# Patient Record
Sex: Male | Born: 1950 | Race: White | Hispanic: No | Marital: Single | Smoking: Current some day smoker
Health system: Southern US, Community
[De-identification: ages and names within clinical notes are randomized; demographics above are authoritative.]

## PROBLEM LIST (undated history)

## (undated) DIAGNOSIS — C060 Malignant neoplasm of cheek mucosa: Secondary | ICD-10-CM

## (undated) DIAGNOSIS — M5030 Other cervical disc degeneration, unspecified cervical region: Secondary | ICD-10-CM

## (undated) DIAGNOSIS — M51369 Other intervertebral disc degeneration, lumbar region without mention of lumbar back pain or lower extremity pain: Secondary | ICD-10-CM

## (undated) DIAGNOSIS — D329 Benign neoplasm of meninges, unspecified: Secondary | ICD-10-CM

## (undated) DIAGNOSIS — M5136 Other intervertebral disc degeneration, lumbar region: Secondary | ICD-10-CM

## (undated) HISTORY — PX: TOTAL HIP REVISION: SHX763

---

## 2011-05-12 DIAGNOSIS — C069 Malignant neoplasm of mouth, unspecified: Secondary | ICD-10-CM | POA: Insufficient documentation

## 2012-05-02 DIAGNOSIS — C069 Malignant neoplasm of mouth, unspecified: Secondary | ICD-10-CM | POA: Insufficient documentation

## 2012-05-13 DIAGNOSIS — E041 Nontoxic single thyroid nodule: Secondary | ICD-10-CM | POA: Insufficient documentation

## 2012-07-22 DIAGNOSIS — G609 Hereditary and idiopathic neuropathy, unspecified: Secondary | ICD-10-CM | POA: Insufficient documentation

## 2012-09-07 DIAGNOSIS — N200 Calculus of kidney: Secondary | ICD-10-CM | POA: Insufficient documentation

## 2012-09-07 DIAGNOSIS — R3911 Hesitancy of micturition: Secondary | ICD-10-CM | POA: Insufficient documentation

## 2012-09-07 DIAGNOSIS — R399 Unspecified symptoms and signs involving the genitourinary system: Secondary | ICD-10-CM | POA: Insufficient documentation

## 2015-06-11 ENCOUNTER — Inpatient Hospital Stay (HOSPITAL_COMMUNITY)
Admission: EM | Admit: 2015-06-11 | Discharge: 2015-06-13 | DRG: 190 | Disposition: A | Discharge status: Home / Self Care | Payer: Self-pay | Attending: Internal Medicine | Admitting: Internal Medicine

## 2015-06-11 ENCOUNTER — Emergency Department (HOSPITAL_COMMUNITY): Payer: Self-pay

## 2015-06-11 ENCOUNTER — Encounter (HOSPITAL_COMMUNITY): Payer: Self-pay | Admitting: Cardiology

## 2015-06-11 DIAGNOSIS — R112 Nausea with vomiting, unspecified: Secondary | ICD-10-CM | POA: Diagnosis present

## 2015-06-11 DIAGNOSIS — Z85818 Personal history of malignant neoplasm of other sites of lip, oral cavity, and pharynx: Secondary | ICD-10-CM

## 2015-06-11 DIAGNOSIS — D72829 Elevated white blood cell count, unspecified: Secondary | ICD-10-CM

## 2015-06-11 DIAGNOSIS — Z590 Homelessness: Secondary | ICD-10-CM

## 2015-06-11 DIAGNOSIS — J209 Acute bronchitis, unspecified: Secondary | ICD-10-CM | POA: Diagnosis present

## 2015-06-11 DIAGNOSIS — F17200 Nicotine dependence, unspecified, uncomplicated: Secondary | ICD-10-CM | POA: Diagnosis present

## 2015-06-11 DIAGNOSIS — R1310 Dysphagia, unspecified: Secondary | ICD-10-CM | POA: Diagnosis present

## 2015-06-11 DIAGNOSIS — J440 Chronic obstructive pulmonary disease with acute lower respiratory infection: Principal | ICD-10-CM | POA: Diagnosis present

## 2015-06-11 DIAGNOSIS — J189 Pneumonia, unspecified organism: Secondary | ICD-10-CM | POA: Diagnosis present

## 2015-06-11 HISTORY — DX: Other intervertebral disc degeneration, lumbar region without mention of lumbar back pain or lower extremity pain: M51.369

## 2015-06-11 HISTORY — DX: Other intervertebral disc degeneration, lumbar region: M51.36

## 2015-06-11 HISTORY — DX: Malignant neoplasm of cheek mucosa: C06.0

## 2015-06-11 LAB — URINALYSIS, ROUTINE W REFLEX MICROSCOPIC
BILIRUBIN URINE: NEGATIVE
GLUCOSE, UA: NEGATIVE mg/dL
HGB URINE DIPSTICK: NEGATIVE
KETONES UR: NEGATIVE mg/dL
Leukocytes, UA: NEGATIVE
Nitrite: NEGATIVE
PH: 6 (ref 5.0–8.0)
Protein, ur: NEGATIVE mg/dL
SPECIFIC GRAVITY, URINE: 1.008 (ref 1.005–1.030)

## 2015-06-11 LAB — I-STAT CG4 LACTIC ACID, ED: Lactic Acid, Venous: 1.93 mmol/L (ref 0.5–2.0)

## 2015-06-11 LAB — CBC
HEMATOCRIT: 40.6 % (ref 39.0–52.0)
Hemoglobin: 12.7 g/dL — ABNORMAL LOW (ref 13.0–17.0)
MCH: 27.9 pg (ref 26.0–34.0)
MCHC: 31.3 g/dL (ref 30.0–36.0)
MCV: 89 fL (ref 78.0–100.0)
PLATELETS: 271 10*3/uL (ref 150–400)
RBC: 4.56 MIL/uL (ref 4.22–5.81)
RDW: 13.7 % (ref 11.5–15.5)
WBC: 22.1 10*3/uL — AB (ref 4.0–10.5)

## 2015-06-11 LAB — COMPREHENSIVE METABOLIC PANEL
ALBUMIN: 3.9 g/dL (ref 3.5–5.0)
ALT: 20 U/L (ref 17–63)
AST: 23 U/L (ref 15–41)
Alkaline Phosphatase: 71 U/L (ref 38–126)
Anion gap: 11 (ref 5–15)
BILIRUBIN TOTAL: 1.4 mg/dL — AB (ref 0.3–1.2)
BUN: 12 mg/dL (ref 6–20)
CHLORIDE: 102 mmol/L (ref 101–111)
CO2: 25 mmol/L (ref 22–32)
CREATININE: 0.94 mg/dL (ref 0.61–1.24)
Calcium: 9.1 mg/dL (ref 8.9–10.3)
GFR calc Af Amer: >60 mL/min (ref >60)
GLUCOSE: 98 mg/dL (ref 65–99)
Potassium: 3.6 mmol/L (ref 3.5–5.1)
Sodium: 138 mmol/L (ref 135–145)
Total Protein: 7.7 g/dL (ref 6.5–8.1)

## 2015-06-11 LAB — INFLUENZA PANEL BY PCR (TYPE A & B)
H1N1 flu by pcr: NOT DETECTED
Influenza A By PCR: NEGATIVE
Influenza B By PCR: NEGATIVE

## 2015-06-11 MED ORDER — SODIUM CHLORIDE 0.9 % IV SOLN
INTRAVENOUS | Status: DC
  Administered 2015-06-11 – 2015-06-13 (×3): via INTRAVENOUS

## 2015-06-11 MED ORDER — GABAPENTIN 300 MG PO CAPS
600.0000 mg | ORAL_CAPSULE | Freq: Three times a day (TID) | ORAL | Status: DC
  Administered 2015-06-11 – 2015-06-13 (×6): 600 mg via ORAL
  Filled 2015-06-11 (×6): qty 2

## 2015-06-11 MED ORDER — IPRATROPIUM-ALBUTEROL 0.5-2.5 (3) MG/3ML IN SOLN
3.0000 mL | Freq: Once | RESPIRATORY_TRACT | Status: AC
  Administered 2015-06-11: 3 mL via RESPIRATORY_TRACT
  Filled 2015-06-11: qty 3

## 2015-06-11 MED ORDER — OXYCODONE-ACETAMINOPHEN 5-325 MG PO TABS
1.0000 | ORAL_TABLET | Freq: Four times a day (QID) | ORAL | Status: DC | PRN
  Administered 2015-06-12 – 2015-06-13 (×4): 1 via ORAL
  Filled 2015-06-11 (×4): qty 1

## 2015-06-11 MED ORDER — CYCLOBENZAPRINE HCL 5 MG PO TABS: 5.0000 mg | ORAL_TABLET | Freq: Three times a day (TID) | ORAL | Status: DC | PRN

## 2015-06-11 MED ORDER — ACETAMINOPHEN 325 MG PO TABS
650.0000 mg | ORAL_TABLET | Freq: Once | ORAL | Status: AC
  Administered 2015-06-11: 650 mg via ORAL
  Filled 2015-06-11: qty 2

## 2015-06-11 MED ORDER — LEVOFLOXACIN IN D5W 750 MG/150ML IV SOLN
750.0000 mg | INTRAVENOUS | Status: DC
  Administered 2015-06-12 – 2015-06-13 (×2): 750 mg via INTRAVENOUS
  Filled 2015-06-11 (×2): qty 150

## 2015-06-11 MED ORDER — LEVOFLOXACIN 750 MG PO TABS
750.0000 mg | ORAL_TABLET | Freq: Once | ORAL | Status: AC
  Administered 2015-06-11: 750 mg via ORAL
  Filled 2015-06-11: qty 1

## 2015-06-11 MED ORDER — SODIUM CHLORIDE 0.9 % IV BOLUS (SEPSIS)
1000.0000 mL | Freq: Once | INTRAVENOUS | Status: AC
  Administered 2015-06-11: 1000 mL via INTRAVENOUS

## 2015-06-11 MED ORDER — ENOXAPARIN SODIUM 40 MG/0.4ML ~~LOC~~ SOLN
40.0000 mg | SUBCUTANEOUS | Status: DC
  Administered 2015-06-11: 40 mg via SUBCUTANEOUS
  Filled 2015-06-11 (×2): qty 0.4

## 2015-06-11 MED ORDER — IBUPROFEN 400 MG PO TABS
800.0000 mg | ORAL_TABLET | Freq: Three times a day (TID) | ORAL | Status: DC | PRN
Start: 2015-06-11 — End: 2015-06-13
  Administered 2015-06-13: 800 mg via ORAL
  Filled 2015-06-11: qty 2

## 2015-06-11 NOTE — H&P (Signed)
[facility] History and Physical  Nathaniel Miles [id number] DOB: 1950/07/13 DOA: 06/11/2015  Referring physician: erRalene Bathe PCP: No primary care provider on file.   Chief Complaint: fatigue  HPI: Nathaniel Miles is a 65 y.o. male  With limited PMHx of DDD and SCC of mouth.  He is a smoker but has been unable to smoke today as he has felt so bad.  He for the last few days says he was been coughing and very fatigued.  Cough is productive of thick yellow sputum.  Claims to have fevers but not checked.  No CP.  Says his symptoms are worsening.  Although he was very limited with the amount of information given and got angry with questioning.  He lives in homeless shelter and has been around sick people.  In the Er, his O2 sats ranged from 88-90 with activity and said he was unable to get up and walk.  Xray did not show PNA.  But his WBC count was significantly elevated at 22.  Flu checked and given levaquin    Review of Systems:  All systems reviewed, negative unless stated above   Past Medical History  Diagnosis Date  . DDD (degenerative disc disease), lumbar   . 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 No pertinent past surgical history. Social History:  reports that he has been smoking.  He does not have any smokeless tobacco history on file. He reports that he does not drink alcohol or use illicit drugs.  Allergies  Allergen Reactions  . Penicillins Anaphylaxis    Has patient had a PCN reaction causing immediate rash, facial/tongue/throat swelling, SOB or lightheadedness with hypotension: Yes Has patient had a PCN reaction causing severe rash involving mucus membranes or skin necrosis: No Has patient had a PCN reaction that required hospitalization Yes Has patient had a PCN reaction occurring within the last 10 years: No If all of the above answers are "NO", then may proceed with Cephalosporin use.   . Stadol [Butorphanol]     History reviewed.  No pertinent family history. -refused to answer "I already told someone"   Prior to Admission medications   Medication Sig Start Date End Date Taking? Authorizing Provider  cyclobenzaprine (FLEXERIL) 10 MG tablet Take 5 mg by mouth 3 (three) times daily as needed for muscle spasms.   Yes Historical Provider, MD  gabapentin (NEURONTIN) 300 MG capsule Take 600 mg by mouth 3 (three) times daily.   Yes Historical Provider, MD  ibuprofen (ADVIL,MOTRIN) 800 MG tablet Take 800 mg by mouth every 8 (eight) hours as needed for moderate pain.    Yes Historical Provider, MD  oxyCODONE-acetaminophen (PERCOCET/ROXICET) 5-325 MG tablet Take 1 tablet by mouth every 6 (six) hours as needed for severe pain.   Yes Historical Provider, MD   Physical Exam: Filed Vitals:   06/11/15 1445 06/11/15 1500 06/11/15 1515 06/11/15 1600  BP: 122/70  130/67   Pulse: 90  87   Temp:      TempSrc:      Resp:  15  13  Height:      Weight:      SpO2: 94% 95% 93% 95%    Wt Readings from Last 3 Encounters:  06/11/15 88.905 kg (196 lb)    General:  Poor eye contact, angry Eyes: PERRL, normal lids, irises & conjunctiva ENT: grossly normal hearing, lips & tongue Neck: no LAD, masses or thyromegaly Cardiovascular: RRR, no m/r/g. No LE edema. Telemetry: SR,  no arrhythmias  Respiratory: rale in left base.no wheezing. Normal respiratory effort. Abdomen: soft, ntnd Skin: no rash or induration seen on limited exam Musculoskeletal: grossly normal tone BUE/BLE Psychiatric: grossly normal mood and affect, speech fluent and appropriate Neurologic: grossly non-focal.          Labs on Admission:  Basic Metabolic Panel:  Recent Labs Lab 06/11/15 1203  NA 138  K 3.6  CL 102  CO2 25  GLUCOSE 98  BUN 12  CREATININE 0.94  CALCIUM 9.1   Liver Function Tests:  Recent Labs Lab 06/11/15 1203  AST 23  ALT 20  ALKPHOS 71  BILITOT 1.4*  PROT 7.7  ALBUMIN 3.9   No results for input(s): LIPASE, AMYLASE in the last  168 hours. No results for input(s): AMMONIA in the last 168 hours. CBC:  Recent Labs Lab 06/11/15 1203  WBC 22.1*  HGB 12.7*  HCT 40.6  MCV 89.0  PLT 271   Cardiac Enzymes: No results for input(s): CKTOTAL, CKMB, CKMBINDEX, TROPONINI in the last 168 hours.  BNP (last 3 results) No results for input(s): BNP in the last 8760 hours.  ProBNP (last 3 results) No results for input(s): PROBNP in the last 8760 hours. - CBG: No results for input(s): GLUCAP in the last 168 hours.  Radiological Exams on Admission: Dg Chest 2 View  06/11/2015  CLINICAL DATA:  Productive cough for 2 days EXAM: CHEST  2 VIEW COMPARISON:  None. FINDINGS: Cardiomediastinal silhouette is unremarkable. No acute infiltrate or pleural effusion. No pulmonary edema. Bony thorax is unremarkable. IMPRESSION: No active cardiopulmonary disease. Electronically Signed   By: Lahoma Crocker M.D.   On: 06/11/2015 13:06      Assessment/Plan Active Problems:   CAP (community acquired pneumonia)  Leukocytosis -presumed PNA/COPD -levaquin -obs  Weakness -PT eval  Tobacco abuse -encourage cessation  Homeless -living in [place] or [place]-- told me both -consult social work  H/o cancer in mouth -says he was treated at [facility] -no records found there  Suspect patient can walk but does not want to go back to shelter-- got VERY angry when I started asking questions regarding what he was doing in [place] Status: full DVT Prophylaxis: Family Communication:  Disposition Plan:   Time spent: 3 min  [facility] Pager [phone]

## 2015-06-11 NOTE — ED Notes (Signed)
Pt. O2 saturation dropping while sleeping. Pt. Placed on 2L O2 at this time.

## 2015-06-11 NOTE — ED Notes (Signed)
Pt reports flu like symptoms for the past couple of days. Cough, congestion and n/v. Reports sick contacts at home.

## 2015-06-11 NOTE — ED Notes (Signed)
EDP at bedside  

## 2015-06-11 NOTE — ED Provider Notes (Signed)
CSN: [id number]     Arrival date & time 06/11/15  1133 History   First MD Initiated Contact with Patient 06/11/15 1202     Chief Complaint  Patient presents with  . Cough  . Fatigue  . Nausea     Patient is a 65 y.o. male presenting with cough. The history is provided by the patient. No language interpreter was used.  Cough  Nathaniel Miles is a 65 y.o. male who presents to the Emergency Department complaining of cough, fatigue.  He reports 2 days of significant fatigue , nausea, vomiting. He developed a significant cough today productive of thick and yellow sputum. He has subjective fevers. He reports increased urination. He denies any chest pain. Symptoms are moderate, constant and worsening.  Past Medical History  Diagnosis Date  . Skin cancer   . DDD (degenerative disc disease), lumbar    History reviewed. No pertinent past surgical history. History reviewed. No pertinent family history. Social History  Substance Use Topics  . Smoking status: Current Some Day Smoker  . Smokeless tobacco: None  . Alcohol Use: No    Review of Systems  Respiratory: Positive for cough.   All other systems reviewed and are negative.     Allergies  Penicillins and Stadol  Home Medications   Prior to Admission medications   Medication Sig Start Date End Date Taking? Authorizing Provider  cyclobenzaprine (FLEXERIL) 10 MG tablet Take 5 mg by mouth 3 (three) times daily as needed for muscle spasms.   Yes Historical Provider, MD  gabapentin (NEURONTIN) 300 MG capsule Take 600 mg by mouth 3 (three) times daily.   Yes Historical Provider, MD  ibuprofen (ADVIL,MOTRIN) 800 MG tablet Take 800 mg by mouth every 8 (eight) hours as needed for moderate pain.    Yes Historical Provider, MD  oxyCODONE-acetaminophen (PERCOCET/ROXICET) 5-325 MG tablet Take 1 tablet by mouth every 6 (six) hours as needed for severe pain.   Yes Historical Provider, MD   BP 132/73 mmHg  Pulse 87  Temp(Src) 98.7 F (37.1 C)  (Oral)  Resp 20  Ht 6' (1.829 m)  Wt 196 lb (88.905 kg)  BMI 26.58 kg/m2  SpO2 100% Physical Exam  Constitutional: He is oriented to person, place, and time. He appears well-developed and well-nourished.  HENT:  Head: Normocephalic and atraumatic.  Cardiovascular: Normal rate and regular rhythm.   No murmur heard. Pulmonary/Chest: Effort normal. No respiratory distress.   Occasional crackles in bilateral bases, left greater than right.  Abdominal: Soft. There is no tenderness. There is no rebound and no guarding.  Musculoskeletal: He exhibits no edema or tenderness.  Neurological: He is alert and oriented to person, place, and time.  Skin: Skin is warm and dry.  Psychiatric: He has a normal mood and affect. His behavior is normal.  Nursing note and vitals reviewed.   ED Course  Procedures (including critical care time) Labs Review Labs Reviewed  COMPREHENSIVE METABOLIC PANEL - Abnormal; Notable for the following:    Total Bilirubin 1.4 (*)    All other components within normal limits  CBC - Abnormal; Notable for the following:    WBC 22.1 (*)    Hemoglobin 12.7 (*)    All other components within normal limits  URINALYSIS, ROUTINE W REFLEX MICROSCOPIC (NOT AT [facility])  I-STAT CG4 LACTIC ACID, ED    Imaging Review Dg Chest 2 View  06/11/2015  CLINICAL DATA:  Productive cough for 2 days EXAM: CHEST  2 VIEW COMPARISON:  None.  FINDINGS: Cardiomediastinal silhouette is unremarkable. No acute infiltrate or pleural effusion. No pulmonary edema. Bony thorax is unremarkable. IMPRESSION: No active cardiopulmonary disease. Electronically Signed   By: Lahoma Crocker M.D.   On: 06/11/2015 13:06   I have personally reviewed and evaluated these images and lab results as part of my medical decision-making.   EKG Interpretation None      MDM   Final diagnoses:  CAP (community acquired pneumonia)    patient here for evaluation of fever, cough, malaise. He has generalized weakness. Chest  x-ray is clear lung exam is concerning for early pneumonia, treated with antibiotics. Patient with borderline hypoxia times dropping to 88 and 90%. Patient feels too weak to go home with his profound weakness. Plan to admit for possible pneumonia.   Quintella Reichert, MD 06/11/15 [phone]

## 2015-06-11 NOTE — ED Notes (Signed)
Attempted report x 1. Provided patient with [place] sandwich and ice water while he waits.

## 2015-06-11 NOTE — Progress Notes (Signed)
Received report from [place], [place] in ED for transfer of pt to [phone].

## 2015-06-12 DIAGNOSIS — J189 Pneumonia, unspecified organism: Secondary | ICD-10-CM

## 2015-06-12 DIAGNOSIS — J209 Acute bronchitis, unspecified: Secondary | ICD-10-CM

## 2015-06-12 LAB — BASIC METABOLIC PANEL
ANION GAP: 8 (ref 5–15)
BUN: 12 mg/dL (ref 6–20)
CHLORIDE: 107 mmol/L (ref 101–111)
CO2: 24 mmol/L (ref 22–32)
CREATININE: 0.9 mg/dL (ref 0.61–1.24)
Calcium: 8.9 mg/dL (ref 8.9–10.3)
GFR calc non Af Amer: >60 mL/min (ref >60)
Glucose, Bld: 118 mg/dL — ABNORMAL HIGH (ref 65–99)
Potassium: 3.9 mmol/L (ref 3.5–5.1)
Sodium: 139 mmol/L (ref 135–145)

## 2015-06-12 LAB — CBC
HEMATOCRIT: 39.1 % (ref 39.0–52.0)
HEMOGLOBIN: 12.6 g/dL — AB (ref 13.0–17.0)
MCH: 28.6 pg (ref 26.0–34.0)
MCHC: 32.2 g/dL (ref 30.0–36.0)
MCV: 88.9 fL (ref 78.0–100.0)
Platelets: 253 10*3/uL (ref 150–400)
RBC: 4.4 MIL/uL (ref 4.22–5.81)
RDW: 13.8 % (ref 11.5–15.5)
WBC: 14.9 10*3/uL — AB (ref 4.0–10.5)

## 2015-06-12 LAB — STREP PNEUMONIAE URINARY ANTIGEN: Strep Pneumo Urinary Antigen: NEGATIVE

## 2015-06-12 LAB — RAPID URINE DRUG SCREEN, HOSP PERFORMED
Amphetamines: NOT DETECTED
BENZODIAZEPINES: NOT DETECTED
Barbiturates: NOT DETECTED
Cocaine: NOT DETECTED
Opiates: NOT DETECTED
Tetrahydrocannabinol: NOT DETECTED

## 2015-06-12 LAB — HIV ANTIBODY (ROUTINE TESTING W REFLEX): HIV SCREEN 4TH GENERATION: NONREACTIVE

## 2015-06-12 LAB — MRSA PCR SCREENING: MRSA by PCR: NEGATIVE

## 2015-06-12 MED ORDER — ONDANSETRON HCL 4 MG/2ML IJ SOLN
4.0000 mg | Freq: Four times a day (QID) | INTRAMUSCULAR | Status: DC | PRN
  Administered 2015-06-12 – 2015-06-13 (×2): 4 mg via INTRAVENOUS
  Filled 2015-06-12 (×2): qty 2

## 2015-06-12 MED ORDER — VENLAFAXINE HCL ER 37.5 MG PO CP24
37.5000 mg | ORAL_CAPSULE | Freq: Every day | ORAL | Status: DC
  Administered 2015-06-12: 37.5 mg via ORAL
  Filled 2015-06-12 (×2): qty 1

## 2015-06-12 NOTE — Progress Notes (Signed)
[facility] PROGRESS NOTE  Nathaniel Miles [id number] DOB: 02-05-1951 DOA: 06/11/2015 PCP: No primary care provider on file.  Assessment/Plan: 1. Suspected community acquired pneumonia versus acute bacterial bronchitis. -Nathaniel Miles presenting with clinical signs symptoms consistent with respiratory tract infection. Possibilities include acute acquired pneumonia versus acute bacterial bronchitis. He was started on Levaquin 750 mg IV every 24 hours -On 06/12/2015 he reports feeling somewhat better -He has been afebrile since admission  - White count trending down to 14,900 from 22,100  -Overall showed improvement, will continue empiric antibiotic therapy, consider transitioning to oral in the next 24 hours if he remains stable   2.   Generalized weakness/functional decline  -Likely related to pneumonia  -By 06/12/2015 he reported feeling better, physical therapy did not recommend further PT follow-up   3.  Homelessness -Social work consult  Code Status: full code  Family Communication:  Disposition Plan: and dysphagia discharge the next 24-48 hours continues to show improvement  Antibiotics:  Levaquin  HPI/Subjective: Nathaniel Miles is a 65 year old with a past medical history of squamous cell carcinoma of mouth, admitted to medicine service on 06/11/2015 when he presented with complaints of generalized weakness, malaise, fatigue. He reported having cough with associated yellow sputum production and shortness of breath. Initial chest x-ray was negative for acute cardiopulmonary disease. There is suspicion that symptoms were secondary to community-acquired pneumonia versus acute bacterial bronchitis. He was started on the pericardium antibiotic therapy with Levaquin 750 mg daily.  Objective: Filed Vitals:   06/12/15 0557 06/12/15 1432  BP: 119/63 109/60  Pulse: 76 81  Temp: 98.1 F (36.7 C)   Resp: 16 18    Intake/Output Summary (Last 24 hours) at 06/12/15 1625 Last data  filed at 06/12/15 1429  Gross per 24 hour  Intake   2060 ml  Output   1950 ml  Net    110 ml   Filed Weights   06/11/15 1141  Weight: 88.905 kg (196 lb)    Exam:   General:  nontoxic appearing, no acute distress  Cardiovascular: Regular rate and rhythm normal S1-S2 no murmurs rubs or gallops   Respiratory: normal respiratory effort, lungs overall clear having upper respiratory rhonchi  Abdomen: soft nontender nondistended   Musculoskeletal:  no edema  Data Reviewed: Basic Metabolic Panel:  Recent Labs Lab 06/11/15 1203 06/12/15 0722  NA 138 139  K 3.6 3.9  CL 102 107  CO2 25 24  GLUCOSE 98 118*  BUN 12 12  CREATININE 0.94 0.90  CALCIUM 9.1 8.9   Liver Function Tests:  Recent Labs Lab 06/11/15 1203  AST 23  ALT 20  ALKPHOS 71  BILITOT 1.4*  PROT 7.7  ALBUMIN 3.9   No results for input(s): LIPASE, AMYLASE in the last 168 hours. No results for input(s): AMMONIA in the last 168 hours. CBC:  Recent Labs Lab 06/11/15 1203 06/12/15 0722  WBC 22.1* 14.9*  HGB 12.7* 12.6*  HCT 40.6 39.1  MCV 89.0 88.9  PLT 271 253   Cardiac Enzymes: No results for input(s): CKTOTAL, CKMB, CKMBINDEX, TROPONINI in the last 168 hours. BNP (last 3 results) No results for input(s): BNP in the last 8760 hours.  ProBNP (last 3 results) No results for input(s): PROBNP in the last 8760 hours.  CBG: No results for input(s): GLUCAP in the last 168 hours.  Recent Results (from the past 240 hour(s))  Culture, blood (routine x 2) Call MD if unable to obtain prior to antibiotics being given  Status: None (Preliminary result)   Collection Time: 06/11/15 10:47 PM  Result Value Ref Range Status   Specimen Description BLOOD LEFT ANTECUBITAL  Final   Special Requests BOTTLES DRAWN AEROBIC ONLY 10CC  Final   Culture NO GROWTH < 12 HOURS  Final   Report Status PENDING  Incomplete  Culture, blood (routine x 2) Call MD if unable to obtain prior to antibiotics being given      Status: None (Preliminary result)   Collection Time: 06/11/15 10:50 PM  Result Value Ref Range Status   Specimen Description BLOOD LEFT ARM  Final   Special Requests BOTTLES DRAWN AEROBIC ONLY 5CC  Final   Culture NO GROWTH < 12 HOURS  Final   Report Status PENDING  Incomplete  MRSA PCR Screening     Status: None   Collection Time: 06/11/15 11:42 PM  Result Value Ref Range Status   MRSA by PCR NEGATIVE NEGATIVE Final    Comment:        The GeneXpert MRSA Assay (FDA approved for NASAL specimens only), is one component of a comprehensive MRSA colonization surveillance program. It is not intended to diagnose MRSA infection nor to guide or monitor treatment for MRSA infections.      Studies: Dg Chest 2 View  06/11/2015  CLINICAL DATA:  Productive cough for 2 days EXAM: CHEST  2 VIEW COMPARISON:  None. FINDINGS: Cardiomediastinal silhouette is unremarkable. No acute infiltrate or pleural effusion. No pulmonary edema. Bony thorax is unremarkable. IMPRESSION: No active cardiopulmonary disease. Electronically Signed   By: Lahoma Crocker M.D.   On: 06/11/2015 13:06    Scheduled Meds: . enoxaparin (LOVENOX) injection  40 mg Subcutaneous Q24H  . gabapentin  600 mg Oral TID  . levofloxacin (LEVAQUIN) IV  750 mg Intravenous Q24H   Continuous Infusions: . sodium chloride 75 mL/hr at 06/12/15 0548    Active Problems:   CAP (community acquired pneumonia)    Time spent: 32 min    Kelvin Cellar  [facility] Pager [phone]. If 7PM-7AM, please contact night-coverage at [email or web address], password [facility] 06/12/2015, 4:25 PM  LOS: 0 days

## 2015-06-12 NOTE — Progress Notes (Signed)
RN asked pt about his personal belongings, pt stated he has phone, phone charger, portable charger, earbuds, flash light, clothes (black jeans, shirts, sweater, trench coat), Black boots. RN noted pt pulling out 2 bottles of medications (flexeril and effexor) RN asked if RN could go through his bag and he stated that it was personal- also mentioned that all his narcotic pain medication is stored at the shelter. RN asked pt to empty his bag, pt started to pull eye glasses out and stated that's all that's in the bag. RN was not able to see the bottle of the bag. RN counted the medications and had pt signed the form that stated that his medication would be locked up in pharmacy. RN hand delivered medications to pharmacy for lock up. Dorita Fray 06/12/2015 7:34 PM

## 2015-06-12 NOTE — Progress Notes (Signed)
   06/12/15 1155  Clinical Encounter Type  Visited With Patient;Health care provider  Visit Type Initial;Social support  Referral From Nurse  Spiritual Encounters  Spiritual Needs Emotional  Stress Factors  Patient Stress Factors Exhausted   Chaplain responded to a request to visit with the patient. Patient is homeless, and is tired of being homeless. Patient intends to transition closer to family in [place] and hopes to find some support there. Chaplain facilitated life review and offered support through empathic listening. Chaplain support available as needed.   Jeri Lager, Chaplain 06/12/2015 11:56 AM

## 2015-06-12 NOTE — Evaluation (Signed)
Physical Therapy Evaluation Patient Details Name: Nathaniel Miles MRN: [id number] DOB: Sep 04, 1950 Today's Date: 06/12/2015   History of Present Illness  65 yo male with onset of cough and noted leukocytosis but no symptoms with imaging.  PMHx:  mouth CA, DDD lumbar spine, living in homeless shelter  Clinical Impression  Pt has some inconsistent LOB that is not anything he cannot control but is up on walker then supervision to come down hall with missteps on gait.  Due to inconsistent nature of the issue, will have PT see him until DC especially since he cannot get therapy at the homeless shelter.    Follow Up Recommendations No PT follow up;Other (comment) (will not have access to services afterward)    Equipment Recommendations  None recommended by PT (Will try devices as needed and follow up with case manager)    Recommendations for Other Services       Precautions / Restrictions Precautions Precautions: Fall (telemetry) Restrictions Weight Bearing Restrictions: No      Mobility  Bed Mobility Overal bed mobility: Modified Independent                Transfers Overall transfer level: Modified independent Equipment used: Rolling walker (2 wheeled);None (Pt set the walker aside)             General transfer comment: pt is able to control sitting and standing  Ambulation/Gait Ambulation/Gait assistance: Supervision;Min guard Ambulation Distance (Feet): 150 Feet Assistive device: Rolling walker (2 wheeled);None (supervised and pt used IV pole at times) Gait Pattern/deviations: Drifts right/left;Wide base of support;Narrow base of support;Decreased stride length;Step-through pattern (unpredictable sidesteps) Gait velocity: normal Gait velocity interpretation: at or above normal speed for age/gender    Stairs            Wheelchair Mobility    Modified Rankin (Stroke Patients Only)       Balance Overall balance assessment:  (Pt is occasionally  misstepping but can control standing)                                           Pertinent Vitals/Pain Pain Assessment: 0-10 Pain Score: 2  Pain Location: back from DDD Pain Intervention(s): Monitored during session    Home Living Family/patient expects to be discharged to:: Shelter/Homeless                      Prior Function Level of Independence: Independent               Hand Dominance        Extremity/Trunk Assessment   Upper Extremity Assessment: Overall WFL for tasks assessed           Lower Extremity Assessment: Overall WFL for tasks assessed      Cervical / Trunk Assessment: Normal  Communication   Communication: No difficulties  Cognition Arousal/Alertness: Awake/alert Behavior During Therapy: WFL for tasks assessed/performed Overall Cognitive Status: Within Functional Limits for tasks assessed                      General Comments General comments (skin integrity, edema, etc.): Pt is getting up to walk with some initial stability and then did sidestep a couple times, immediately turning to PT and asking why he would do this.  Not entirely sure of his presentation, but does have 98% O2 sat pregait and 95% post  gait with no supplemental O2.    Exercises        Assessment/Plan    PT Assessment Patient needs continued PT services  PT Diagnosis Abnormality of gait   PT Problem List Decreased activity tolerance;Decreased balance;Decreased mobility;Decreased cognition;Decreased coordination;Decreased knowledge of use of DME;Decreased safety awareness;Cardiopulmonary status limiting activity  PT Treatment Interventions DME instruction;Gait training;Functional mobility training;Therapeutic activities;Therapeutic exercise;Balance training;Neuromuscular re-education;Patient/family education   PT Goals (Current goals can be found in the Care Plan section) Acute Rehab PT Goals Patient Stated Goal: feel better PT Goal  Formulation: With patient Time For Goal Achievement: 06/26/15 Potential to Achieve Goals: Good    Frequency Min 2X/week   Barriers to discharge   home in shelter with others there    Co-evaluation               End of Session Equipment Utilized During Treatment: Gait belt Activity Tolerance: Patient tolerated treatment well Patient left: in bed;with nursing/sitter in room;with call bell/phone within reach Nurse Communication: Mobility status    Functional Assessment Tool Used: clinical judgment Functional Limitation: Mobility: Walking and moving around Mobility: Walking and Moving Around Current Status [phone]): At least 1 percent but less than 20 percent impaired, limited or restricted Mobility: Walking and Moving Around Goal Status [id number]): At least 1 percent but less than 20 percent impaired, limited or restricted    Time: [id number] PT Time Calculation (min) (ACUTE ONLY): 25 min   Charges:   PT Evaluation $PT Eval Low Complexity: 1 Procedure PT Treatments $Gait Training: 8-22 mins   PT G Codes:   PT G-Codes **NOT FOR INPATIENT CLASS** Functional Assessment Tool Used: clinical judgment Functional Limitation: Mobility: Walking and moving around Mobility: Walking and Moving Around Current Status [id number]): At least 1 percent but less than 20 percent impaired, limited or restricted Mobility: Walking and Moving Around Goal Status [phone]): At least 1 percent but less than 20 percent impaired, limited or restricted    Ramond Dial 06/12/2015, 3:05 PM  Mee Hives, PT MS Acute Rehab Dept. Number: [phone] [phone] and [place] [phone]

## 2015-06-13 LAB — BASIC METABOLIC PANEL
ANION GAP: 7 (ref 5–15)
BUN: 12 mg/dL (ref 6–20)
CALCIUM: 8.6 mg/dL — AB (ref 8.9–10.3)
CO2: 24 mmol/L (ref 22–32)
Chloride: 110 mmol/L (ref 101–111)
Creatinine, Ser: 0.92 mg/dL (ref 0.61–1.24)
Glucose, Bld: 104 mg/dL — ABNORMAL HIGH (ref 65–99)
Potassium: 4.4 mmol/L (ref 3.5–5.1)
SODIUM: 141 mmol/L (ref 135–145)

## 2015-06-13 LAB — CBC
HCT: 36.6 % — ABNORMAL LOW (ref 39.0–52.0)
HEMOGLOBIN: 11.6 g/dL — AB (ref 13.0–17.0)
MCH: 28.6 pg (ref 26.0–34.0)
MCHC: 31.7 g/dL (ref 30.0–36.0)
MCV: 90.4 fL (ref 78.0–100.0)
Platelets: 251 10*3/uL (ref 150–400)
RBC: 4.05 MIL/uL — AB (ref 4.22–5.81)
RDW: 13.7 % (ref 11.5–15.5)
WBC: 10.8 10*3/uL — ABNORMAL HIGH (ref 4.0–10.5)

## 2015-06-13 MED ORDER — DOXYCYCLINE HYCLATE 100 MG PO TABS: 100.0000 mg | ORAL_TABLET | Freq: Two times a day (BID) | ORAL | Status: DC

## 2015-06-13 MED ORDER — NICOTINE 21 MG/24HR TD PT24: 21.0000 mg | MEDICATED_PATCH | Freq: Every day | TRANSDERMAL | Status: DC

## 2015-06-13 NOTE — Care Management Note (Signed)
Case Management Note  Patient Details  Name: Nathaniel Miles MRN: [id number] Date of Birth: 08/28/1950  Subjective/Objective:          Admitted with respiratory distress. Homeless.        Action/Plan: Referral made to CSW . CM to f/u with disposition needs.  Expected Discharge Date:                  Expected Discharge Plan:  Home/Self Care  In-House Referral:  Clinical Social Work (homeless)  Discharge planning Services  CM Consult  Post Acute Care Choice:    Choice offered to:     DME Arranged:    DME Agency:     HH Arranged:    HH Agency:     Status of Service:  In process, will continue to follow  Medicare Important Message Given:    Date Medicare IM Given:    Medicare IM give by:    Date Additional Medicare IM Given:    Additional Medicare Important Message give by:     If discussed at [place] of Stay Meetings, dates discussed:    Additional Comments:  Sharin Mons, [place] [phone] 06/13/2015, 10:16 AM

## 2015-06-13 NOTE — Progress Notes (Signed)
Physical Therapy Treatment Patient Details Name: Nathaniel Miles MRN: [id number] DOB: Jun 29, 1950 Today's Date: 06/13/2015    History of Present Illness 65 yo male with onset of cough and noted leukocytosis but no symptoms with imaging.  PMHx:  mouth CA, DDD lumbar spine, living in homeless shelter    PT Comments    Pt was seen for assessment of current gait which he is handling well on level surfaces with IV pole for maintenance of his line.   He is getting up to walk with nursing in attendance per pt but he has no bed alarm in place.  Pt stood with no assist.  Leans off BOS with no assist, walks with pt pushing his IV pole and no LOB over his trip.  Asking for a cane but may not need it, so will make final determination tomorrow.  Follow Up Recommendations  No PT follow up     Equipment Recommendations  None recommended by PT    Recommendations for Other Services       Precautions / Restrictions Precautions Precautions: Fall Precaution Comments: Pt has been getting up with no assistance Restrictions Weight Bearing Restrictions: No    Mobility  Bed Mobility Overal bed mobility: Modified Independent                Transfers Overall transfer level: Modified independent Equipment used: None (pt pushed IV pole)             General transfer comment: pt is able to control sitting and standing  Ambulation/Gait Ambulation/Gait assistance: Supervision Ambulation Distance (Feet): 250 Feet Assistive device: None (pt pushed IV pole) Gait Pattern/deviations: Narrow base of support;Step-through pattern Gait velocity: normal Gait velocity interpretation: at or above normal speed for age/gender General Gait Details: Pt walked nearly tandem with no LOB   Stairs            Wheelchair Mobility    Modified Rankin (Stroke Patients Only)       Balance Overall balance assessment: No apparent balance deficits (not formally assessed)                                  Cognition Arousal/Alertness: Awake/alert Behavior During Therapy: Impulsive Overall Cognitive Status: Within Functional Limits for tasks assessed                      Exercises      General Comments General comments (skin integrity, edema, etc.): Pt did walk with PT without his O2 and no SOB, no distress and no LOB      Pertinent Vitals/Pain Pain Assessment: Faces Faces Pain Scale: Hurts a little bit Pain Location: neck pain Pain Descriptors / Indicators: Aching Pain Intervention(s): Other (comment) (Pt would not let PT talk wiht him about treatment, interupte)    Home Living                      Prior Function            PT Goals (current goals can now be found in the care plan section) Acute Rehab PT Goals Patient Stated Goal: to get treatment for his neck Progress towards PT goals: Progressing toward goals    Frequency  Min 2X/week    PT Plan Current plan remains appropriate    Co-evaluation             End of Session Equipment Utilized  During Treatment: Gait belt Activity Tolerance: Patient tolerated treatment well Patient left: in bed;with call bell/phone within reach     Time: 1302-1333 PT Time Calculation (min) (ACUTE ONLY): 31 min  Charges:  $Gait Training: 8-22 mins $Therapeutic Activity: 8-22 mins                    G Codes:      Ramond Dial July 09, 2015, 2:53 PM   Mee Hives, PT MS Acute Rehab Dept. Number: [phone] [id number] and [place] [phone]

## 2015-06-13 NOTE — Progress Notes (Signed)
After pt's shower pt came out of room fully dressed and stated that his boots were missing. RN yesterday saw black object in plastic bag, that pt did not let RN check that black bag and stated that it was his black boots in there and no need for RN to check it. RN had insisted but pt was not allowing RN physically lay eyes on it. Charge nurse today try to talk to pt and insure him that we will have security go look for his boots. Pt would not listen to what charge nurse had to say. [place] got involved and spoke with pt while Camera operator paged MD to come speak with pt. MD came to unit and spoke with pt.  Director informed Floor RN that "Director walked with pt outside, pt took off walking toward urgent care and pulled out cigarette and started smoking, director called security to have pt escorted back to room. Security left unit to attempt to find pt's black boots." Pt currently in room at this time sitting on bedside. Call bell within reach. Dorita Fray 06/13/2015 6:50 PM

## 2015-06-13 NOTE — Progress Notes (Signed)
Pt request for a shower, pt informed RN that he feel weak. RN offered a sink bath with chair set up. Pt refused stated that if RN does not disconnect his IV he will cut the IV line. Pt would not listen to RN education about fall risk. RN had charge nurse come in and speak with Pt. Charge nurse walked pt in the hall way and stated pt is able to shower with stand by assist. Charge nurse informed CNA Tech and RN disconnected pt from IV pump. RN informed CNA tech to wrap pt's iv site before he showers. CNA tech verbalized understanding. Pt in room with CNA tech at this time preparing to shower. Dorita Fray 06/13/2015 4:43 PM

## 2015-06-13 NOTE — Discharge Summary (Signed)
Physician Discharge Summary  Gianlucas Govin [id number] DOB: 1950-10-24 DOA: 06/11/2015  PCP: No primary care provider on file.  Admit date: 06/11/2015 Discharge date: 06/13/2015  Time spent: 35   minutes  Recommendations for Outpatient Follow-up:  1. Mr Buchko was admitted for probable acute bronchitis, despite informing him that it was against hospital policy to smoke, nursing staff reported he went across the street to smoke a cigarettes for which security was called. 2. He was discharged on doxycycline 100 mg by mouth twice a day.   Discharge Diagnoses:  Active Problems:   CAP (community acquired pneumonia)   Discharge Condition: Stable  Diet recommendation: Heart healthy  Filed Weights   06/11/15 1141  Weight: 88.905 kg (196 lb)    History of present illness:  Nathaniel Miles is a 65 y.o. male  With limited PMHx of DDD and SCC of mouth. He is a smoker but has been unable to smoke today as he has felt so bad. He for the last few days says he was been coughing and very fatigued. Cough is productive of thick yellow sputum. Claims to have fevers but not checked. No CP. Says his symptoms are worsening. Although he was very limited with the amount of information given and got angry with questioning. He lives in homeless shelter and has been around sick people.  In the Er, his O2 sats ranged from 88-90 with activity and said he was unable to get up and walk. Xray did not show PNA. But his WBC count was significantly elevated at 22. Flu checked and given levaquin  Hospital Course:  Mr Caughell is a 65 year old with a past medical history of squamous cell carcinoma of mouth, admitted to medicine service on 06/11/2015 when he presented with complaints of generalized weakness, malaise, fatigue. He reported having cough with associated yellow sputum production and shortness of breath. Initial chest x-ray was negative for acute cardiopulmonary disease. There is suspicion that  symptoms were secondary to community-acquired pneumonia versus acute bacterial bronchitis. He was started on the pericardium antibiotic therapy with Levaquin 750 mg daily. He showed significant clinical improvement and by 06/13/2015 he was transitioned to oral antibiotic therapy. Unfortunately Mr. Suntken was unwilling to participate in his plan of care. He was clearly informed that it was against hospital policy to smoke as nursing staff reported he 1 across the street to the bus stop to have a cigarette. Security was called as he was escorted back to his room. On my reassessment he is hemodynamically stable, afebrile, had been tolerating by mouth intake. He had already been transition to oral antibiotics. Thus he was discharged on oral antibiotic therapy.     Discharge Exam: Filed Vitals:   06/13/15 0352 06/13/15 1521  BP: 115/72 118/72  Pulse: 66 66  Temp: 97.7 F (36.5 C) 98.1 F (36.7 C)  Resp: 20 18    General: Nontoxic-appearing, angry, no acute distress.  Cardiovascular: Regular rate and rhythm normal S1-S2  Respiratory: Lungs clear to auscultation bilaterally Abdomen: Soft nontender nondistended  Discharge Instructions   Discharge Instructions    Call MD for:  difficulty breathing, headache or visual disturbances    Complete by:  As directed      Call MD for:  extreme fatigue    Complete by:  As directed      Call MD for:  hives    Complete by:  As directed      Call MD for:  persistant dizziness or light-headedness    Complete by:  As directed      Call MD for:  persistant nausea and vomiting    Complete by:  As directed      Call MD for:  redness, tenderness, or signs of infection (pain, swelling, redness, odor or green/yellow discharge around incision site)    Complete by:  As directed      Call MD for:  severe uncontrolled pain    Complete by:  As directed      Call MD for:  temperature >100.4    Complete by:  As directed      Call MD for:    Complete by:  As  directed      Diet - low sodium heart healthy    Complete by:  As directed      Increase activity slowly    Complete by:  As directed           Current Discharge Medication List    START taking these medications   Details  doxycycline (VIBRA-TABS) 100 MG tablet Take 1 tablet (100 mg total) by mouth every 12 (twelve) hours. Qty: 10 tablet, Refills: 0      CONTINUE these medications which have NOT CHANGED   Details  cyclobenzaprine (FLEXERIL) 10 MG tablet Take 5 mg by mouth 3 (three) times daily as needed for muscle spasms.    gabapentin (NEURONTIN) 300 MG capsule Take 600 mg by mouth 3 (three) times daily.    ibuprofen (ADVIL,MOTRIN) 800 MG tablet Take 800 mg by mouth every 8 (eight) hours as needed for moderate pain.     oxyCODONE-acetaminophen (PERCOCET/ROXICET) 5-325 MG tablet Take 1 tablet by mouth every 6 (six) hours as needed for severe pain.       Allergies  Allergen Reactions  . Penicillins Anaphylaxis    Has patient had a PCN reaction causing immediate rash, facial/tongue/throat swelling, SOB or lightheadedness with hypotension: Yes Has patient had a PCN reaction causing severe rash involving mucus membranes or skin necrosis: No Has patient had a PCN reaction that required hospitalization Yes Has patient had a PCN reaction occurring within the last 10 years: No If all of the above answers are "NO", then may proceed with Cephalosporin use.   . Stadol [Butorphanol]       The results of significant diagnostics from this hospitalization (including imaging, microbiology, ancillary and laboratory) are listed below for reference.    Significant Diagnostic Studies: Dg Chest 2 View  06/11/2015  CLINICAL DATA:  Productive cough for 2 days EXAM: CHEST  2 VIEW COMPARISON:  None. FINDINGS: Cardiomediastinal silhouette is unremarkable. No acute infiltrate or pleural effusion. No pulmonary edema. Bony thorax is unremarkable. IMPRESSION: No active cardiopulmonary disease.  Electronically Signed   By: Lahoma Crocker M.D.   On: 06/11/2015 13:06    Microbiology: Recent Results (from the past 240 hour(s))  Culture, blood (routine x 2) Call MD if unable to obtain prior to antibiotics being given     Status: None (Preliminary result)   Collection Time: 06/11/15 10:47 PM  Result Value Ref Range Status   Specimen Description BLOOD LEFT ANTECUBITAL  Final   Special Requests BOTTLES DRAWN AEROBIC ONLY 10CC  Final   Culture NO GROWTH 2 DAYS  Final   Report Status PENDING  Incomplete  Culture, blood (routine x 2) Call MD if unable to obtain prior to antibiotics being given     Status: None (Preliminary result)   Collection Time: 06/11/15 10:50 PM  Result Value Ref Range Status  Specimen Description BLOOD LEFT ARM  Final   Special Requests BOTTLES DRAWN AEROBIC ONLY 5CC  Final   Culture NO GROWTH 2 DAYS  Final   Report Status PENDING  Incomplete  MRSA PCR Screening     Status: None   Collection Time: 06/11/15 11:42 PM  Result Value Ref Range Status   MRSA by PCR NEGATIVE NEGATIVE Final    Comment:        The GeneXpert MRSA Assay (FDA approved for NASAL specimens only), is one component of a comprehensive MRSA colonization surveillance program. It is not intended to diagnose MRSA infection nor to guide or monitor treatment for MRSA infections.      Labs: Basic Metabolic Panel:  Recent Labs Lab 06/11/15 1203 06/12/15 0722 06/13/15 0538  NA 138 139 141  K 3.6 3.9 4.4  CL 102 107 110  CO2 25 24 24   GLUCOSE 98 118* 104*  BUN 12 12 12   CREATININE 0.94 0.90 0.92  CALCIUM 9.1 8.9 8.6*   Liver Function Tests:  Recent Labs Lab 06/11/15 1203  AST 23  ALT 20  ALKPHOS 71  BILITOT 1.4*  PROT 7.7  ALBUMIN 3.9   No results for input(s): LIPASE, AMYLASE in the last 168 hours. No results for input(s): AMMONIA in the last 168 hours. CBC:  Recent Labs Lab 06/11/15 1203 06/12/15 0722 06/13/15 0538  WBC 22.1* 14.9* 10.8*  HGB 12.7* 12.6* 11.6*   HCT 40.6 39.1 36.6*  MCV 89.0 88.9 90.4  PLT 271 253 251   Cardiac Enzymes: No results for input(s): CKTOTAL, CKMB, CKMBINDEX, TROPONINI in the last 168 hours. BNP: BNP (last 3 results) No results for input(s): BNP in the last 8760 hours.  ProBNP (last 3 results) No results for input(s): PROBNP in the last 8760 hours.  CBG: No results for input(s): GLUCAP in the last 168 hours.     Signed:  Kelvin Cellar MD.  [facility] 06/13/2015, 6:50 PM

## 2015-06-13 NOTE — Progress Notes (Signed)
[facility] PROGRESS NOTE  Jb Koda [id number] DOB: 08-10-1950 DOA: 06/11/2015 PCP: No primary care provider on file.  Assessment/Plan: 1. Suspected community acquired pneumonia versus acute bacterial bronchitis. -Nathaniel Miles presenting with clinical signs symptoms consistent with respiratory tract infection. Possibilities include acute acquired pneumonia versus acute bacterial bronchitis. He was started on Levaquin 750 mg IV every 24 hours -On 06/12/2015 he reports feeling somewhat better -He has been afebrile since admission  -White count normalized to 10.8 on 06/13/2015 -Looks better, anticipate discharge in am if he continues to show improvement -Will stop IV levaquin and transiton to Doxy  2.   Generalized weakness/functional decline  -Likely related to pneumonia  -By 06/12/2015 he reported feeling better, physical therapy did not recommend further PT follow-up   3.  Homelessness -Social work consult  Code Status: full code  Family Communication:  Disposition Plan: Anticipate discharge in the next 24 hours  Antibiotics:  Levaquin  HPI/Subjective: Nathaniel Miles is a 65 year old with a past medical history of squamous cell carcinoma of mouth, admitted to medicine service on 06/11/2015 when he presented with complaints of generalized weakness, malaise, fatigue. He reported having cough with associated yellow sputum production and shortness of breath. Initial chest x-ray was negative for acute cardiopulmonary disease. There is suspicion that symptoms were secondary to community-acquired pneumonia versus acute bacterial bronchitis. He was started on the pericardium antibiotic therapy with Levaquin 750 mg daily.  Objective: Filed Vitals:   06/13/15 0352 06/13/15 1521  BP: 115/72 118/72  Pulse: 66 66  Temp: 97.7 F (36.5 C) 98.1 F (36.7 C)  Resp: 20 18    Intake/Output Summary (Last 24 hours) at 06/13/15 1733 Last data filed at 06/13/15 0944  Gross per 24 hour   Intake 2932.5 ml  Output   2170 ml  Net  762.5 ml   Filed Weights   06/11/15 1141  Weight: 88.905 kg (196 lb)    Exam:   General:  nontoxic appearing, no acute distress  Cardiovascular: Regular rate and rhythm normal S1-S2 no murmurs rubs or gallops   Respiratory: normal respiratory effort, lungs overall clear having upper respiratory rhonchi  Abdomen: soft nontender nondistended   Musculoskeletal:  no edema  Data Reviewed: Basic Metabolic Panel:  Recent Labs Lab 06/11/15 1203 06/12/15 0722 06/13/15 0538  NA 138 139 141  K 3.6 3.9 4.4  CL 102 107 110  CO2 25 24 24   GLUCOSE 98 118* 104*  BUN 12 12 12   CREATININE 0.94 0.90 0.92  CALCIUM 9.1 8.9 8.6*   Liver Function Tests:  Recent Labs Lab 06/11/15 1203  AST 23  ALT 20  ALKPHOS 71  BILITOT 1.4*  PROT 7.7  ALBUMIN 3.9   No results for input(s): LIPASE, AMYLASE in the last 168 hours. No results for input(s): AMMONIA in the last 168 hours. CBC:  Recent Labs Lab 06/11/15 1203 06/12/15 0722 06/13/15 0538  WBC 22.1* 14.9* 10.8*  HGB 12.7* 12.6* 11.6*  HCT 40.6 39.1 36.6*  MCV 89.0 88.9 90.4  PLT 271 253 251   Cardiac Enzymes: No results for input(s): CKTOTAL, CKMB, CKMBINDEX, TROPONINI in the last 168 hours. BNP (last 3 results) No results for input(s): BNP in the last 8760 hours.  ProBNP (last 3 results) No results for input(s): PROBNP in the last 8760 hours.  CBG: No results for input(s): GLUCAP in the last 168 hours.  Recent Results (from the past 240 hour(s))  Culture, blood (routine x 2) Call MD if unable to obtain prior  to antibiotics being given     Status: None (Preliminary result)   Collection Time: 06/11/15 10:47 PM  Result Value Ref Range Status   Specimen Description BLOOD LEFT ANTECUBITAL  Final   Special Requests BOTTLES DRAWN AEROBIC ONLY 10CC  Final   Culture NO GROWTH 2 DAYS  Final   Report Status PENDING  Incomplete  Culture, blood (routine x 2) Call MD if unable to  obtain prior to antibiotics being given     Status: None (Preliminary result)   Collection Time: 06/11/15 10:50 PM  Result Value Ref Range Status   Specimen Description BLOOD LEFT ARM  Final   Special Requests BOTTLES DRAWN AEROBIC ONLY 5CC  Final   Culture NO GROWTH 2 DAYS  Final   Report Status PENDING  Incomplete  MRSA PCR Screening     Status: None   Collection Time: 06/11/15 11:42 PM  Result Value Ref Range Status   MRSA by PCR NEGATIVE NEGATIVE Final    Comment:        The GeneXpert MRSA Assay (FDA approved for NASAL specimens only), is one component of a comprehensive MRSA colonization surveillance program. It is not intended to diagnose MRSA infection nor to guide or monitor treatment for MRSA infections.      Studies: No results found.  Scheduled Meds: . enoxaparin (LOVENOX) injection  40 mg Subcutaneous Q24H  . gabapentin  600 mg Oral TID  . levofloxacin (LEVAQUIN) IV  750 mg Intravenous Q24H  . venlafaxine XR  37.5 mg Oral Q breakfast   Continuous Infusions: . sodium chloride 75 mL/hr at 06/13/15 [id number]    Active Problems:   CAP (community acquired pneumonia)    Time spent: 67 min    Nathaniel Miles  [facility] Pager [phone]. If 7PM-7AM, please contact night-coverage at [email or web address], password [facility] 06/13/2015, 5:33 PM  LOS: 1 day

## 2015-06-13 NOTE — Progress Notes (Signed)
RN attempted to place patient's bed in lowest position as well as place footboard on bed, both for safety. Patient refused bed to be in lowest position as well as refused to have footboard placed on bed. Patient became angry when this RN tried to explain safety reasons.

## 2015-06-13 NOTE — Progress Notes (Signed)
RN attempted to lower pt's bed to the lowest level. Pt refused and put his bed back up. RN attempted to put the foot board on the bed. Pt refused and stated that it needs to sty off to have foot room. RN educated pt the importance of lowering the bed and foot board. Pt continued to refuse. RN placed pt on bed alarm. Dorita Fray 06/13/2015 9:36 AM

## 2015-06-13 NOTE — Progress Notes (Signed)
Nsg Discharge Note  Admit Date:  06/11/2015 Discharge date: 06/13/2015   Nathaniel Miles to be D/C'd home per MD order.  AVS completed.  Copy for chart, and copy for patient signed, and dated. Patient able to verbalize understanding.  Discharge Medication:   Medication List    TAKE these medications        cyclobenzaprine 10 MG tablet  Commonly known as:  FLEXERIL  Take 5 mg by mouth 3 (three) times daily as needed for muscle spasms.     doxycycline 100 MG tablet  Commonly known as:  VIBRA-TABS  Take 1 tablet (100 mg total) by mouth every 12 (twelve) hours.     gabapentin 300 MG capsule  Commonly known as:  NEURONTIN  Take 600 mg by mouth 3 (three) times daily.     ibuprofen 800 MG tablet  Commonly known as:  ADVIL,MOTRIN  Take 800 mg by mouth every 8 (eight) hours as needed for moderate pain.     oxyCODONE-acetaminophen 5-325 MG tablet  Commonly known as:  PERCOCET/ROXICET  Take 1 tablet by mouth every 6 (six) hours as needed for severe pain.        Discharge Assessment: Filed Vitals:   06/13/15 0352 06/13/15 1521  BP: 115/72 118/72  Pulse: 66 66  Temp: 97.7 F (36.5 C) 98.1 F (36.7 C)  Resp: 20 18   Skin clean, dry and intact without evidence of skin break down, no evidence of skin tears noted. IV catheter discontinued with catheter tip intact. Site without signs and symptoms of complications - no redness or edema noted at insertion site, patient denies c/o pain - only slight tenderness at site.  Dressing with slight pressure applied.  D/c Instructions-Education: Discharge instructions given to patient with verbalized understanding. D/c education completed with patient including follow up instructions, medication list, d/c activities limitations if indicated, with other d/c instructions as indicated by MD - patient able to verbalize understanding, all questions fully answered. Patient instructed to return to ED, call 911, or call MD for any changes in condition.   Patient in room packing his belonging and will be escorted by security off of unit.   Dorita Fray, RN 06/13/2015 7:39 PM

## 2015-06-16 LAB — CULTURE, BLOOD (ROUTINE X 2)
CULTURE: NO GROWTH
CULTURE: NO GROWTH

## 2015-06-19 MED FILL — ?DOXYCYCLINE 100 MG TABLET: 100 | 5 days supply | Qty: 10 | Fill #0

## 2015-06-25 ENCOUNTER — Emergency Department (HOSPITAL_COMMUNITY): Payer: Self-pay

## 2015-06-25 ENCOUNTER — Emergency Department (HOSPITAL_COMMUNITY)
Admission: EM | Admit: 2015-06-25 | Discharge: 2015-06-25 | Disposition: A | Discharge status: Home / Self Care | Payer: Self-pay | Attending: Emergency Medicine | Admitting: Emergency Medicine

## 2015-06-25 ENCOUNTER — Encounter (HOSPITAL_COMMUNITY): Payer: Self-pay | Admitting: Emergency Medicine

## 2015-06-25 DIAGNOSIS — Z79899 Other long term (current) drug therapy: Secondary | ICD-10-CM | POA: Insufficient documentation

## 2015-06-25 DIAGNOSIS — Z85828 Personal history of other malignant neoplasm of skin: Secondary | ICD-10-CM | POA: Insufficient documentation

## 2015-06-25 DIAGNOSIS — Z880 Allergy status to penicillin: Secondary | ICD-10-CM | POA: Insufficient documentation

## 2015-06-25 DIAGNOSIS — Z8739 Personal history of other diseases of the musculoskeletal system and connective tissue: Secondary | ICD-10-CM | POA: Insufficient documentation

## 2015-06-25 DIAGNOSIS — J189 Pneumonia, unspecified organism: Secondary | ICD-10-CM

## 2015-06-25 DIAGNOSIS — F17200 Nicotine dependence, unspecified, uncomplicated: Secondary | ICD-10-CM | POA: Insufficient documentation

## 2015-06-25 DIAGNOSIS — R531 Weakness: Secondary | ICD-10-CM | POA: Insufficient documentation

## 2015-06-25 DIAGNOSIS — R0902 Hypoxemia: Secondary | ICD-10-CM | POA: Insufficient documentation

## 2015-06-25 DIAGNOSIS — R079 Chest pain, unspecified: Secondary | ICD-10-CM | POA: Insufficient documentation

## 2015-06-25 DIAGNOSIS — J159 Unspecified bacterial pneumonia: Secondary | ICD-10-CM | POA: Insufficient documentation

## 2015-06-25 HISTORY — DX: Other cervical disc degeneration, unspecified cervical region: M50.30

## 2015-06-25 HISTORY — DX: Benign neoplasm of meninges, unspecified: D32.9

## 2015-06-25 MED ORDER — BENZONATATE 100 MG PO CAPS: 100.0000 mg | ORAL_CAPSULE | Freq: Three times a day (TID) | ORAL | Status: DC

## 2015-06-25 MED ORDER — LEVOFLOXACIN 500 MG PO TABS: 500.0000 mg | ORAL_TABLET | Freq: Two times a day (BID) | ORAL | Status: DC

## 2015-06-25 MED ORDER — LEVOFLOXACIN 750 MG PO TABS: 750.0000 mg | ORAL_TABLET | Freq: Every day | ORAL | Status: DC

## 2015-06-25 NOTE — ED Notes (Signed)
sats while ambulating was between 94%-96% RA. [place] EDP

## 2015-06-25 NOTE — ED Provider Notes (Signed)
CSN: [id number]     Arrival date & time 06/25/15  [id number] History   First MD Initiated Contact with Patient 06/25/15 [phone]     Chief Complaint  Patient presents with  . Pneumonia     (Consider location/radiation/quality/duration/timing/severity/associated sxs/prior Treatment) HPI Comments: 65 y/o with recent CAP comes in with weakness. Reports that he never felt that have fully recovered, but over the weekend he started getting worse. Pt has a cough - dry. He has chest pain, generalized with cough. Pt has bouts of chills. Pt also has dyspnea. It appears that he was admitted for CAP few days ago, pt had hypoxia and was thought to be in early CAP. He was discharged with doxy. Pt saw [facility] RN yday, and was prescribed some antibiotics - but the pharmacy was closed by the time he got there.   Pt has no hx of PE, DVT and denies any exogenous estrogen use, long distance travels or surgery in the past 6 weeks, active cancer, recent immobilization.   ROS 10 Systems reviewed and are negative for acute change except as noted in the HPI.   Patient is a 65 y.o. male presenting with pneumonia. The history is provided by the patient.  Pneumonia    Past Medical History  Diagnosis Date  . DDD (degenerative disc disease), lumbar   . 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 Meningioma ([place])   . Degenerative disc disease, cervical    History reviewed. No pertinent past surgical history. History reviewed. No pertinent family history. Social History  Substance Use Topics  . Smoking status: Current Some Day Smoker  . Smokeless tobacco: None  . Alcohol Use: No    Review of Systems    Allergies  Penicillins and Stadol  Home Medications   Prior to Admission medications   Medication Sig Start Date End Date Taking? Authorizing Provider  gabapentin (NEURONTIN) 300 MG capsule Take 600 mg by mouth 3 (three) times daily.   Yes Historical Provider, MD  oxyCODONE-acetaminophen (PERCOCET) 10-325  MG tablet Take 1 tablet by mouth every 6 (six) hours as needed for pain.   Yes Historical Provider, MD  venlafaxine XR (EFFEXOR-XR) 37.5 MG 24 hr capsule Take 75 mg by mouth daily with breakfast.   Yes Historical Provider, MD  benzonatate (TESSALON) 100 MG capsule Take 1 capsule (100 mg total) by mouth every 8 (eight) hours. 06/25/15   Varney Biles, MD  doxycycline (VIBRA-TABS) 100 MG tablet Take 1 tablet (100 mg total) by mouth every 12 (twelve) hours. Patient not taking: Reported on 06/25/2015 06/13/15   Kelvin Cellar, MD  levofloxacin (LEVAQUIN) 750 MG tablet Take 1 tablet (750 mg total) by mouth daily. 06/25/15   Keneth Borg, MD   BP 130/72 mmHg  Pulse 90  Temp(Src) 98.5 F (36.9 C) (Oral)  Resp 18  SpO2 95% Physical Exam  Constitutional: He is oriented to person, place, and time. He appears well-developed.  HENT:  Head: Atraumatic.  Neck: Neck supple.  Cardiovascular: Normal rate.   Pulmonary/Chest: Effort normal. He has rales.  L sided rales at the base  Musculoskeletal: He exhibits no edema or tenderness.  Neurological: He is alert and oriented to person, place, and time.  Skin: Skin is warm.  Nursing note and vitals reviewed.   ED Course  Procedures (including critical care time) Labs Review Labs Reviewed  CBC WITH DIFFERENTIAL/PLATELET  BASIC METABOLIC PANEL    Imaging Review Dg Chest 2 View  06/25/2015  CLINICAL DATA:  Increased cough and weakness EXAM: CHEST  2 VIEW COMPARISON:  06/11/2015 FINDINGS: Cardiac shadow is within normal limits. The lungs are well aerated bilaterally. Minimal left basilar atelectasis is seen. No focal confluent infiltrate is noted. No bony abnormality is seen. IMPRESSION: Minimal left basilar atelectasis. Electronically Signed   By: Inez Catalina M.D.   On: 06/25/2015 07:51   I have personally reviewed and evaluated these images and lab results as part of my medical decision-making.   EKG Interpretation None      MDM   Final  diagnoses:  CAP (community acquired pneumonia)    Pt comes in with cc of dib, weakness, cough. Recent tx for CAP with doxy. Reports never feeling completely well, but has declined over the weekend. CXR - L sided opacity, with exam showing L sided rales. Last time he needed admission due to hypoxia. Ambulatory pulso - 94%. No PE risk factors. No signs of dvt.  Will give him levaquin. He has a Thursday pcp f/u.     Varney Biles, MD 06/25/15 1234

## 2015-06-25 NOTE — ED Notes (Signed)
Pt reports he was admitted at [facility] with PNA and was discharged on the 3rd.  Pt reports he never got better and is feeling worse.  Pt reports diff breathing.  Denies cp at this time.  Pt is A&Ox 4.  Pt reports he has been having a hard time getting around d/t SOB.  He reports going [facility] yesterday and was given another abx but never had it filled.

## 2015-06-25 NOTE — Progress Notes (Addendum)
Consulted by ED charge RN when pt needing assist with medicine Pt homeless, has been seen by [facility] under Dr Doreene Burke at [place] called in pt Levaquin to [facility] [phone] CM left a message at health dept [phone] to inquire if pt can come to pick up levaquin and tessalon perrles  Cm spoke with pt about going to health dept to pick up levaquin, tessalon perrles and if discussed which one was an antibiotic and which was for his cough Pt has a bus pass to get to health dept Cm discussed [facility] ED will only provide one bus pass Cm walked pt to ED entrance

## 2015-06-25 NOTE — Discharge Instructions (Signed)
We saw you in the ER for the cough, weakness. All the results in the ER are normal, labs and imaging.  We are not sure what is causing your symptoms - possibly a pneumonia or a viral infection. The workup in the ER is not complete, and is limited to screening for life threatening and emergent conditions only, so please see a primary care doctor for further evaluation.  Take the antibiotics.  Please return to the ER if your symptoms worsen; you have increased pain, fevers, chills, inability to keep any medications down, confusion. Otherwise see the outpatient doctor as requested.    Community-Acquired Pneumonia, Adult Pneumonia is an infection of the lungs. There are different types of pneumonia. One type can develop while a person is in a hospital. A different type, called community-acquired pneumonia, develops in people who are not, or have not recently been, in the hospital or other health care facility.  CAUSES Pneumonia may be caused by bacteria, viruses, or funguses. Community-acquired pneumonia is often caused by Streptococcus pneumonia bacteria. These bacteria are often passed from one person to another by breathing in droplets from the cough or sneeze of an infected person. RISK FACTORS The condition is more likely to develop in:  People who havechronic diseases, such as chronic obstructive pulmonary disease (COPD), asthma, congestive heart failure, cystic fibrosis, diabetes, or kidney disease.  People who haveearly-stage or late-stage HIV.  People who havesickle cell disease.  People who havehad their spleen removed (splenectomy).  People who havepoor Human resources officer.  People who havemedical conditions that increase the risk of breathing in (aspirating) secretions their own mouth and nose.   People who havea weakened immune system (immunocompromised).  People who smoke.  People whotravel to areas where pneumonia-causing germs commonly exist.  People whoare around  animal habitats or animals that have pneumonia-causing germs, including birds, bats, rabbits, cats, and farm animals. SYMPTOMS Symptoms of this condition include:  Adry cough.  A wet (productive) cough.  Fever.  Sweating.  Chest pain, especially when breathing deeply or coughing.  Rapid breathing or difficulty breathing.  Shortness of breath.  Shaking chills.  Fatigue.  Muscle aches. DIAGNOSIS Your health care provider will take a medical history and perform a physical exam. You may also have other tests, including:  Imaging studies of your chest, including X-rays.  Tests to check your blood oxygen level and other blood gases.  Other tests on blood, mucus (sputum), fluid around your lungs (pleural fluid), and urine. If your pneumonia is severe, other tests may be done to identify the specific cause of your illness. TREATMENT The type of treatment that you receive depends on many factors, such as the cause of your pneumonia, the medicines you take, and other medical conditions that you have. For most adults, treatment and recovery from pneumonia may occur at home. In some cases, treatment must happen in a hospital. Treatment may include:  Antibiotic medicines, if the pneumonia was caused by bacteria.  Antiviral medicines, if the pneumonia was caused by a virus.  Medicines that are given by mouth or through an IV tube.  Oxygen.  Respiratory therapy. Although rare, treating severe pneumonia may include:  Mechanical ventilation. This is done if you are not breathing well on your own and you cannot maintain a safe blood oxygen level.  Thoracentesis. This procedureremoves fluid around one lung or both lungs to help you breathe better. HOME CARE INSTRUCTIONS  Take over-the-counter and prescription medicines only as told by your health care provider.  Only takecough medicine if you are losing sleep. Understand that cough medicine can prevent your body's natural ability  to remove mucus from your lungs.  If you were prescribed an antibiotic medicine, take it as told by your health care provider. Do not stop taking the antibiotic even if you start to feel better.  Sleep in a semi-upright position at night. Try sleeping in a reclining chair, or place a few pillows under your head.  Do not use tobacco products, including cigarettes, chewing tobacco, and e-cigarettes. If you need help quitting, ask your health care provider.  Drink enough water to keep your urine clear or pale yellow. This will help to thin out mucus secretions in your lungs. PREVENTION There are ways that you can decrease your risk of developing community-acquired pneumonia. Consider getting a pneumococcal vaccine if:  You are older than 63 years of age.  You are older than 63 years of age and are undergoing cancer treatment, have chronic lung disease, or have other medical conditions that affect your immune system. Ask your health care provider if this applies to you. There are different types and schedules of pneumococcal vaccines. Ask your health care provider which vaccination option is best for you. You may also prevent community-acquired pneumonia if you take these actions:  Get an influenza vaccine every year. Ask your health care provider which type of influenza vaccine is best for you.  Go to the dentist on a regular basis.  Wash your hands often. Use hand sanitizer if soap and water are not available. SEEK MEDICAL CARE IF:  You have a fever.  You are losing sleep because you cannot control your cough with cough medicine. SEEK IMMEDIATE MEDICAL CARE IF:  You have worsening shortness of breath.  You have increased chest pain.  Your sickness becomes worse, especially if you are an older adult or have a weakened immune system.  You cough up blood.   This information is not intended to replace advice given to you by your health care provider. Make sure you discuss any  questions you have with your health care provider.   Document Released: 04/27/2005 Document Revised: 01/16/2015 Document Reviewed: 08/22/2014 Elsevier Interactive Patient Education [employer].

## 2015-06-25 NOTE — ED Notes (Signed)
Holding off on labs per RN

## 2015-06-25 NOTE — ED Notes (Signed)
[facility].  States that he does not feel better and is c/o "achyness" all over, dry cough.  States he was seen at the [facility] and was given another abx to try but has not yet gotten it filled.  Denies NVD.

## 2015-06-26 ENCOUNTER — Encounter: Payer: Self-pay | Admitting: Internal Medicine

## 2015-06-26 ENCOUNTER — Ambulatory Visit: Payer: Self-pay | Attending: Internal Medicine | Admitting: Internal Medicine

## 2015-06-26 VITALS — BP 114/70 | HR 71 | Temp 97.5°F | Resp 16 | Ht 72.0 in | Wt 198.0 lb

## 2015-06-26 DIAGNOSIS — Z720 Tobacco use: Secondary | ICD-10-CM

## 2015-06-26 DIAGNOSIS — J42 Unspecified chronic bronchitis: Secondary | ICD-10-CM

## 2015-06-26 DIAGNOSIS — C069 Malignant neoplasm of mouth, unspecified: Secondary | ICD-10-CM

## 2015-06-26 DIAGNOSIS — F17200 Nicotine dependence, unspecified, uncomplicated: Secondary | ICD-10-CM | POA: Insufficient documentation

## 2015-06-26 MED ORDER — ALBUTEROL SULFATE HFA 108 (90 BASE) MCG/ACT IN AERS: 2.0000 | INHALATION_SPRAY | Freq: Four times a day (QID) | RESPIRATORY_TRACT | Status: DC | PRN

## 2015-06-26 MED FILL — VENTOLIN HFA 90 MCG INHALER: 108 (90 BAS | 30 days supply | Qty: 18 | Fill #0

## 2015-06-26 NOTE — Progress Notes (Signed)
Patient's here for community acquired pneumonia.   Patient states he's having HA's, chest pain from cough, rated 7/10. Described as painful with cough, aching crushing  feeling with SOB.   Pain isn't constant in chest, just with cough.

## 2015-06-26 NOTE — Patient Instructions (Addendum)
Return to clinic within 2 weeks for smoking cessation counselling  Stop at pharmacy for medications  Finish the levofloxacin  Return to clinic to establish with MD in 2-4 weeks

## 2015-06-26 NOTE — Assessment & Plan Note (Signed)
He understands need to quit. Will ask for smoking cessation counselling

## 2015-06-26 NOTE — Assessment & Plan Note (Signed)
This is the patients most likely problem. He never really had pneumonia. I think he needs to be treated for bronchospasm- albuterol MDI.  I'll ask that he establish with [facility]

## 2015-06-26 NOTE — Assessment & Plan Note (Signed)
He has regular f/u with [facility]

## 2015-06-26 NOTE — Progress Notes (Signed)
Patient comes for f/u of bronchitis- was hospitalized at [facility] and then was seen at [facility] yesterday. He complaints of cough and has some chest pain when he coughs. Has chronic SOB, unchanged from baseline. No fever or chills.   PMH, meds, SHx reviewed He is a 1ppd smoker- wants to quit  Exam:  BP 114/70 mmHg  Pulse 71  Temp(Src) 97.5 F (36.4 C) (Oral)  Resp 16  Ht 6' (1.829 m)  Wt 198 lb (89.812 kg)  BMI 26.85 kg/m2  SpO2 97% NAD.  Chest with diffuse wheezing with forced expiration.  cv- reg rate abd- soft extr- no edema

## 2015-07-03 ENCOUNTER — Other Ambulatory Visit: Payer: Self-pay | Admitting: Internal Medicine

## 2015-07-03 MED ORDER — ALBUTEROL SULFATE HFA 108 (90 BASE) MCG/ACT IN AERS: 2.0000 | INHALATION_SPRAY | Freq: Four times a day (QID) | RESPIRATORY_TRACT | Status: DC | PRN

## 2015-07-04 ENCOUNTER — Ambulatory Visit: Payer: Self-pay

## 2015-07-25 DIAGNOSIS — Z79899 Other long term (current) drug therapy: Secondary | ICD-10-CM

## 2015-07-30 NOTE — Congregational Nurse Program (Signed)
Congregational Nurse Program Note  Date of Encounter: 07/25/2015  Past Medical History: Past Medical History  Diagnosis Date  . DDD (degenerative disc disease), lumbar   . 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 Meningioma ([place])   . Degenerative disc disease, cervical     Encounter Details:     CNP Questionnaire - 07/25/15 1442    Patient Demographics   Is this a new or existing patient? New   Patient is considered a/an Not Applicable   Race Caucasian/White   Patient Assistance   Location of Patient Assistance Not Applicable   Patient's financial/insurance status Low Income;[place] [employer]   Uninsured Patient Yes   Interventions Counseled to make appt. with provider   Patient referred to apply for the following financial assistance Not Applicable   Food insecurities addressed Provided food supplies   Transportation assistance No   Assistance securing medications No   Educational health offerings Navigating the healthcare system;Medications   Encounter Details   Primary purpose of visit Education/Health Concerns;Navigating the Healthcare System   Was an Emergency Department visit averted? Not Applicable   Does patient have a medical provider? Yes   Patient referred to Establish PCP   Was a mental health screening completed? (GAINS tool) No   Does patient have dental issues? No   Does patient have vision issues? No   Since previous encounter, have you referred patient for abnormal blood pressure that resulted in a new diagnosis or medication change? No   Since previous encounter, have you referred patient for abnormal blood glucose that resulted in a new diagnosis or medication change? No   For Abstraction Use Only   Does patient have insurance? No       Client concerned that he will be running out of his Gabapentin.  States is seen at [facility].  Encouraged to contact then for refill

## 2015-08-06 DIAGNOSIS — Z5900 Homelessness unspecified: Secondary | ICD-10-CM

## 2015-08-06 NOTE — Congregational Nurse Program (Signed)
Congregational Nurse Program Note  Date of Encounter: 08/06/2015  Past Medical History: Past Medical History  Diagnosis Date  . DDD (degenerative disc disease), lumbar   . 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 Meningioma ([place])   . Degenerative disc disease, cervical     Encounter Details:     CNP Questionnaire - 08/01/15 1347    Patient Demographics   Is this a new or existing patient? New   Patient is considered a/an Not Applicable   Race Caucasian/White   Patient Assistance   Location of Patient Assistance Not Applicable   Patient's financial/insurance status Low Income;[place] [employer]/Care Connects   Uninsured Patient Yes   Interventions Counseled to make appt. with provider   Patient referred to apply for the following financial assistance Not Applicable   Food insecurities addressed Provided food supplies   Transportation assistance No   Assistance securing medications No   Educational health offerings Navigating the healthcare system;Medications   Encounter Details   Primary purpose of visit Education/Health Concerns;Navigating the Healthcare System   Was an Emergency Department visit averted? Not Applicable   Does patient have a medical provider? Yes   Patient referred to Establish PCP   Was a mental health screening completed? (GAINS tool) No   Does patient have dental issues? No   Does patient have vision issues? No   Since previous encounter, have you referred patient for abnormal blood pressure that resulted in a new diagnosis or medication change? No   Since previous encounter, have you referred patient for abnormal blood glucose that resulted in a new diagnosis or medication change? No   For Abstraction Use Only   Does patient have insurance? No     Bus passes provided so he could pick up medication from [employer] and the [facility] program. Expressed concerned that he will be homeless (emergency shelter closing end of the month).  Discussed with him his  application process with Medicaid.  Assisted him with obtaining a SOAR advocate.  Arranged appointment with Social Worker at [facility] to assist with navigating social services and how his needs can be better met.

## 2016-03-19 ENCOUNTER — Emergency Department (HOSPITAL_COMMUNITY)
Admission: EM | Admit: 2016-03-19 | Discharge: 2016-03-19 | Disposition: A | Discharge status: Home / Self Care | Payer: Medicaid Other | Attending: Dermatology | Admitting: Dermatology

## 2016-03-19 ENCOUNTER — Encounter (HOSPITAL_COMMUNITY): Payer: Self-pay

## 2016-03-19 DIAGNOSIS — Z5321 Procedure and treatment not carried out due to patient leaving prior to being seen by health care provider: Secondary | ICD-10-CM | POA: Diagnosis not present

## 2016-03-19 DIAGNOSIS — F17200 Nicotine dependence, unspecified, uncomplicated: Secondary | ICD-10-CM | POA: Insufficient documentation

## 2016-03-19 DIAGNOSIS — K1370 Unspecified lesions of oral mucosa: Secondary | ICD-10-CM | POA: Insufficient documentation

## 2016-03-19 DIAGNOSIS — Z85828 Personal history of other malignant neoplasm of skin: Secondary | ICD-10-CM | POA: Diagnosis not present

## 2016-03-19 NOTE — ED Triage Notes (Signed)
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 He noticed multiple lesions in his mouth and on his tongue and had "four punch biopsies" about two weeks ago and states the pain is getting worse. He is unable to drink and has not slept. The mouth pain is his concern today.

## 2016-03-19 NOTE — ED Notes (Signed)
Pt walking in and out of ED.  Drinking beverage, though states it hurts.  Updated on status.

## 2016-03-19 NOTE — ED Notes (Signed)
Pt states he is leaving because he is tired of waiting.  No rooms available to place pt.

## 2016-03-20 ENCOUNTER — Encounter (HOSPITAL_COMMUNITY): Payer: Self-pay | Admitting: Nurse Practitioner

## 2016-03-20 ENCOUNTER — Emergency Department (HOSPITAL_COMMUNITY)
Admission: EM | Admit: 2016-03-20 | Discharge: 2016-03-20 | Disposition: A | Discharge status: Home / Self Care | Payer: Medicaid Other | Attending: Emergency Medicine | Admitting: Emergency Medicine

## 2016-03-20 DIAGNOSIS — F17200 Nicotine dependence, unspecified, uncomplicated: Secondary | ICD-10-CM | POA: Diagnosis not present

## 2016-03-20 DIAGNOSIS — K1379 Other lesions of oral mucosa: Secondary | ICD-10-CM

## 2016-03-20 DIAGNOSIS — Z85828 Personal history of other malignant neoplasm of skin: Secondary | ICD-10-CM | POA: Diagnosis not present

## 2016-03-20 DIAGNOSIS — B370 Candidal stomatitis: Secondary | ICD-10-CM | POA: Diagnosis not present

## 2016-03-20 MED ORDER — NYSTATIN 100000 UNIT/ML MT SUSP: 500000.0000 [IU] | Freq: Four times a day (QID) | OROMUCOSAL | 0 refills | Status: DC

## 2016-03-20 MED ORDER — OXYCODONE-ACETAMINOPHEN 5-325 MG PO TABS: 1.0000 | ORAL_TABLET | Freq: Four times a day (QID) | ORAL | 0 refills | Status: DC | PRN

## 2016-03-20 MED ORDER — MAGIC MOUTHWASH W/LIDOCAINE: 5.0000 mL | Freq: Three times a day (TID) | ORAL | 0 refills | Status: DC | PRN

## 2016-03-20 MED ORDER — LIDOCAINE VISCOUS 2 % MT SOLN
15.0000 mL | Freq: Once | OROMUCOSAL | Status: AC
  Administered 2016-03-20: 15 mL via OROMUCOSAL

## 2016-03-20 MED ORDER — BUTAMBEN-TETRACAINE-BENZOCAINE 2-2-14 % EX AERO
1.0000 | INHALATION_SPRAY | Freq: Once | CUTANEOUS | Status: DC
Start: 2016-03-20 — End: 2016-03-20
  Filled 2016-03-20: qty 20

## 2016-03-20 MED ORDER — BUTAMBEN-TETRACAINE-BENZOCAINE 2-2-14 % EX AERO
1.0000 | INHALATION_SPRAY | Freq: Once | CUTANEOUS | Status: AC
  Administered 2016-03-20: 1 via TOPICAL

## 2016-03-20 MED ORDER — BENZOCAINE 20 % MT AERO
INHALATION_SPRAY | Freq: Once | OROMUCOSAL | Status: DC
  Filled 2016-03-20: qty 57

## 2016-03-20 MED ORDER — FLUCONAZOLE 100 MG PO TABS
200.0000 mg | ORAL_TABLET | Freq: Once | ORAL | Status: AC
  Administered 2016-03-20: 200 mg via ORAL
  Filled 2016-03-20: qty 2

## 2016-03-20 NOTE — ED Notes (Signed)
ED Provider at bedside. 

## 2016-03-20 NOTE — ED Notes (Addendum)
Tongue is severley sore and has white sores all over his tongue and inside of mouth where he had squamous cell carcinoma removed about a week and 1/2 for biopsy.
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
## 2016-03-20 NOTE — ED Notes (Signed)
RN attempted to move pt to Waiting room for higher acuity bed. Pt refusing to leave room. Pt requesting to speak with MD to have spray for mouth. Does not want to vacate room. MD made aware.
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
## 2016-07-03 DIAGNOSIS — H26101 Unspecified traumatic cataract, right eye: Secondary | ICD-10-CM | POA: Insufficient documentation

## 2016-07-03 DIAGNOSIS — Z87828 Personal history of other (healed) physical injury and trauma: Secondary | ICD-10-CM | POA: Insufficient documentation

## 2016-08-12 ENCOUNTER — Encounter (HOSPITAL_COMMUNITY): Payer: Self-pay

## 2016-08-12 ENCOUNTER — Emergency Department (HOSPITAL_COMMUNITY)
Admission: EM | Admit: 2016-08-12 | Discharge: 2016-08-12 | Disposition: A | Discharge status: Home / Self Care | Payer: Medicaid Other | Attending: Emergency Medicine | Admitting: Emergency Medicine

## 2016-08-12 ENCOUNTER — Emergency Department (HOSPITAL_COMMUNITY): Payer: Medicaid Other

## 2016-08-12 DIAGNOSIS — M13851 Other specified arthritis, right hip: Secondary | ICD-10-CM | POA: Diagnosis not present

## 2016-08-12 DIAGNOSIS — F17200 Nicotine dependence, unspecified, uncomplicated: Secondary | ICD-10-CM | POA: Insufficient documentation

## 2016-08-12 DIAGNOSIS — M25551 Pain in right hip: Secondary | ICD-10-CM | POA: Diagnosis present

## 2016-08-12 DIAGNOSIS — Z79899 Other long term (current) drug therapy: Secondary | ICD-10-CM | POA: Insufficient documentation

## 2016-08-12 MED ORDER — ACETAMINOPHEN 500 MG PO TABS: 1000.0000 mg | ORAL_TABLET | Freq: Three times a day (TID) | ORAL | 0 refills | Status: AC

## 2016-08-12 MED ORDER — HYDROCODONE-ACETAMINOPHEN 5-325 MG PO TABS
1.0000 | ORAL_TABLET | Freq: Once | ORAL | Status: AC
  Administered 2016-08-12: 1 via ORAL
  Filled 2016-08-12: qty 1

## 2016-08-12 MED ORDER — OXYCODONE-ACETAMINOPHEN 5-325 MG PO TABS: 1.0000 | ORAL_TABLET | Freq: Three times a day (TID) | ORAL | 0 refills | Status: AC | PRN

## 2016-08-12 NOTE — ED Triage Notes (Signed)
Patient complains of right hip pain for years that has gotten worse the past 4 months. Taking percocet and ibuprofen with some relief. The pain is now waking him from sleep

## 2016-08-12 NOTE — ED Notes (Signed)
Pt states he is not allergic to norco.

## 2016-08-12 NOTE — ED Provider Notes (Signed)
[facility] DEPT Provider Note   CSN: [id number] Arrival date & time: 08/12/16  1420  By signing my name below, I, Neta Mends, attest that this documentation has been prepared under the direction and in the presence of Fatima Blank, MD . Electronically Signed: Neta Mends, ED Scribe. 08/12/2016. 4:10 PM.    History   Chief Complaint No chief complaint on file.   The history is provided by the patient. No language interpreter was used.   HPI Comments:  Nathaniel Miles is a 65 y.o. male with PMHx of degenerative disk disease and squamous cell carcinoma of the mouth who presents to the Emergency Department complaining of ongoing, worsening right hip pain for several years. He repots a diagnosis of arthritis in the right hip, and he states that the pain has been worsening over the past 4 month. He also reports right lower back pain. He notes that he walks with a limp, and his right foot points inward when walking. He reports that he had his cancer removed on 05/02/2012, and he reports that he had a major flare up in his mouth several months ago. Pt has an appointment in 15 days with his PCP. Pt has taken prescribed percocet today and has taken ibuprofen with no relief. Pt denies other associated symptoms.   Past Medical History:  Diagnosis Date  . DDD (degenerative disc disease), lumbar   . Degenerative disc disease, cervical   . Meningioma ([place])   . Squamous cell cancer of buccal mucosa [facility])     Patient Active Problem List   Diagnosis Date Noted  . Chronic bronchitis ([place]) 06/26/2015  . Smoker 06/26/2015  . Squamous cell carcinoma of mouth ([place]) 05/12/2011    History reviewed. No pertinent surgical history.     Home Medications    Prior to Admission medications   Medication Sig Start Date End Date Taking? Authorizing Provider  acetaminophen (TYLENOL) 500 MG tablet Take 2 tablets (1,000 mg total) by mouth every 8 (eight) hours. Do not take more than  4000 mg of acetaminophen (Tylenol) in a 24-hour period. Please note that other medicines that you may be prescribed may have Tylenol as well. 08/12/16 08/17/16  Fatima Blank, MD  albuterol (PROVENTIL HFA;VENTOLIN HFA) 108 (90 Base) MCG/ACT inhaler Inhale 2 puffs into the lungs every 6 (six) hours as needed for wheezing or shortness of breath. 07/03/15   Tresa Garter, MD  gabapentin (NEURONTIN) 300 MG capsule Take 600 mg by mouth 3 (three) times daily.    Historical Provider, MD  magic mouthwash w/lidocaine SOLN Take 5 mLs by mouth 3 (three) times daily as needed for mouth pain. 03/20/16   Hope Bunnie Pion, NP  nystatin (MYCOSTATIN) 100000 UNIT/ML suspension Take 5 mLs (500,000 Units total) by mouth 4 (four) times daily. 03/20/16   Hope Bunnie Pion, NP  oxyCODONE-acetaminophen (PERCOCET/ROXICET) 5-325 MG tablet Take 1 tablet by mouth every 8 (eight) hours as needed for severe pain. Please do not exceed 4000 mg of acetaminophen (Tylenol) a 24-hour period. Please note that he may be prescribed additional medicine that contains acetaminophen. 08/12/16 08/27/16  Fatima Blank, MD  venlafaxine XR (EFFEXOR-XR) 37.5 MG 24 hr capsule Take 75 mg by mouth daily with breakfast.    Historical Provider, MD    Family History No family history on file.  Social History Social History  Substance Use Topics  . Smoking status: Current Some Day Smoker  . Smokeless tobacco: Never Used  . Alcohol use No  Allergies   Penicillins and Stadol [butorphanol]   Review of Systems Review of Systems 10 Systems reviewed and are negative for acute change except as noted in the HPI.   Physical Exam Updated Vital Signs BP 134/82 (BP Location: Left Arm)   Pulse (!) 104   Temp 98.3 F (36.8 C) (Oral)   Resp 18   SpO2 96%   Physical Exam  Constitutional: He is oriented to person, place, and time. He appears well-developed and well-nourished. No distress.  HENT:  Head: Normocephalic and atraumatic.    Nose: Nose normal.  Eyes: Conjunctivae and EOM are normal. Pupils are equal, round, and reactive to light. Right eye exhibits no discharge. Left eye exhibits no discharge. No scleral icterus.  Neck: Normal range of motion. Neck supple.  Cardiovascular: Normal rate and regular rhythm.  Exam reveals no gallop and no friction rub.   No murmur heard. Pulmonary/Chest: Effort normal and breath sounds normal. No stridor. No respiratory distress. He has no rales.  Abdominal: Soft. He exhibits no distension. There is no tenderness.  Musculoskeletal: He exhibits no edema.       Right hip: He exhibits tenderness. He exhibits normal range of motion, normal strength, no swelling, no crepitus and no deformity.  Pain with ROM of right hip  Neurological: He is alert and oriented to person, place, and time.  Skin: Skin is warm and dry. No rash noted. He is not diaphoretic. No erythema.  Psychiatric: He has a normal mood and affect.  Vitals reviewed.    ED Treatments / Results  DIAGNOSTIC STUDIES:  Oxygen Saturation is 96% on RA, normal by my interpretation.    COORDINATION OF CARE:  4:06 PM Discussed treatment plan with pt at bedside and pt agreed to plan.   Labs (all labs ordered are listed, but only abnormal results are displayed) Labs Reviewed - No data to display  EKG  EKG Interpretation None       Radiology Dg Hip Unilat W Or Wo Pelvis 2-3 Views Right  Result Date: 08/12/2016 CLINICAL DATA:  Intermittent right-sided hip pain for the past 5 years. No known recent injury. EXAM: DG HIP (WITH OR WITHOUT PELVIS) 2-3V RIGHT COMPARISON:  None in PACs FINDINGS: The bones are subjectively adequately mineralized. The pelvis exhibits no lytic or blastic lesion. The observed portions of the sacrum are normal. There is asymmetric narrowing of the superior aspect of the right hip joint space. The articular surfaces of the femoral head and acetabulum remains smoothly rounded. The femoral neck,  intertrochanteric, and subtrochanteric regions are normal. IMPRESSION: Asymmetric joint space loss of the right hip consistent with osteoarthritis. No acute bony abnormality. Electronically Signed   By: David  Martinique M.D.   On: 08/12/2016 14:49    Procedures Procedures (including critical care time)  Medications Ordered in ED Medications  HYDROcodone-acetaminophen (NORCO/VICODIN) 5-325 MG per tablet 1 tablet (1 tablet Oral Given 08/12/16 1615)     Initial Impression / Assessment and Plan / ED Course  I have reviewed the triage vital signs and the nursing notes.  Pertinent labs & imaging results that were available during my care of the patient were reviewed by me and considered in my medical decision making (see chart for details).     Plain film consistent with right hip arthritis. No evidence of bony lesions concerning for metastatic disease. Patient does not have any lower back pain and does not have any evidence suggesting cauda equina.  A narcotic database reviewed and patient had  a recent prescription filled for Percocet on 3/23 for 2 week supply. Will provide additional 5 days worth until he is able to see his PCP. Will provide referral to Ortho.  The patient is safe for discharge with strict return precautions.   Final Clinical Impressions(s) / ED Diagnoses   Final diagnoses:  Allergic arthritis of right hip   Disposition: Discharge  Condition: Good  I have discussed the results, Dx and Tx plan with the patient who expressed understanding and agree(s) with the plan. Discharge instructions discussed at great length. The patient was given strict return precautions who verbalized understanding of the instructions. No further questions at time of discharge.    New Prescriptions   ACETAMINOPHEN (TYLENOL) 500 MG TABLET    Take 2 tablets (1,000 mg total) by mouth every 8 (eight) hours. Do not take more than 4000 mg of acetaminophen (Tylenol) in a 24-hour period. Please note that  other medicines that you may be prescribed may have Tylenol as well.    Follow Up: Marliss Coots, NP [place] [address] [phone]  Schedule an appointment as soon as possible for a visit  in 3-5 days  Vickey Huger, MD [place] [address] [phone]  Schedule an appointment as soon as possible for a visit  As needed for evaluation of right hip arthritis   I personally performed the services described in this documentation, which was scribed in my presence. The recorded information has been reviewed and is accurate.        Fatima Blank, MD 08/12/16 [phone]

## 2016-08-21 ENCOUNTER — Emergency Department (HOSPITAL_COMMUNITY): Payer: Medicaid Other

## 2016-08-21 ENCOUNTER — Encounter (HOSPITAL_COMMUNITY): Payer: Self-pay | Admitting: Emergency Medicine

## 2016-08-21 ENCOUNTER — Inpatient Hospital Stay (HOSPITAL_COMMUNITY)
Admission: EM | Admit: 2016-08-21 | Discharge: 2016-08-24 | DRG: 871 | Disposition: A | Discharge status: Home / Self Care | Payer: Medicaid Other | Attending: Internal Medicine | Admitting: Internal Medicine

## 2016-08-21 DIAGNOSIS — R9389 Abnormal findings on diagnostic imaging of other specified body structures: Secondary | ICD-10-CM | POA: Diagnosis present

## 2016-08-21 DIAGNOSIS — R739 Hyperglycemia, unspecified: Secondary | ICD-10-CM | POA: Diagnosis present

## 2016-08-21 DIAGNOSIS — Z880 Allergy status to penicillin: Secondary | ICD-10-CM

## 2016-08-21 DIAGNOSIS — J189 Pneumonia, unspecified organism: Secondary | ICD-10-CM | POA: Diagnosis present

## 2016-08-21 DIAGNOSIS — Z888 Allergy status to other drugs, medicaments and biological substances status: Secondary | ICD-10-CM | POA: Diagnosis not present

## 2016-08-21 DIAGNOSIS — R938 Abnormal findings on diagnostic imaging of other specified body structures: Secondary | ICD-10-CM | POA: Diagnosis present

## 2016-08-21 DIAGNOSIS — J440 Chronic obstructive pulmonary disease with acute lower respiratory infection: Secondary | ICD-10-CM | POA: Diagnosis present

## 2016-08-21 DIAGNOSIS — R0689 Other abnormalities of breathing: Secondary | ICD-10-CM | POA: Diagnosis present

## 2016-08-21 DIAGNOSIS — R51 Headache: Secondary | ICD-10-CM | POA: Diagnosis present

## 2016-08-21 DIAGNOSIS — R0781 Pleurodynia: Secondary | ICD-10-CM | POA: Diagnosis not present

## 2016-08-21 DIAGNOSIS — F17200 Nicotine dependence, unspecified, uncomplicated: Secondary | ICD-10-CM | POA: Diagnosis present

## 2016-08-21 DIAGNOSIS — M5136 Other intervertebral disc degeneration, lumbar region: Secondary | ICD-10-CM | POA: Diagnosis present

## 2016-08-21 DIAGNOSIS — J181 Lobar pneumonia, unspecified organism: Secondary | ICD-10-CM

## 2016-08-21 DIAGNOSIS — M5030 Other cervical disc degeneration, unspecified cervical region: Secondary | ICD-10-CM | POA: Diagnosis present

## 2016-08-21 DIAGNOSIS — D509 Iron deficiency anemia, unspecified: Secondary | ICD-10-CM | POA: Diagnosis present

## 2016-08-21 DIAGNOSIS — Z79899 Other long term (current) drug therapy: Secondary | ICD-10-CM

## 2016-08-21 DIAGNOSIS — A419 Sepsis, unspecified organism: Secondary | ICD-10-CM | POA: Diagnosis not present

## 2016-08-21 DIAGNOSIS — J441 Chronic obstructive pulmonary disease with (acute) exacerbation: Secondary | ICD-10-CM | POA: Diagnosis present

## 2016-08-21 DIAGNOSIS — I444 Left anterior fascicular block: Secondary | ICD-10-CM | POA: Diagnosis present

## 2016-08-21 DIAGNOSIS — Z85818 Personal history of malignant neoplasm of other sites of lip, oral cavity, and pharynx: Secondary | ICD-10-CM | POA: Diagnosis not present

## 2016-08-21 DIAGNOSIS — T380X5A Adverse effect of glucocorticoids and synthetic analogues, initial encounter: Secondary | ICD-10-CM | POA: Diagnosis present

## 2016-08-21 DIAGNOSIS — J9601 Acute respiratory failure with hypoxia: Secondary | ICD-10-CM | POA: Diagnosis present

## 2016-08-21 DIAGNOSIS — D649 Anemia, unspecified: Secondary | ICD-10-CM | POA: Diagnosis present

## 2016-08-21 LAB — CBC WITH DIFFERENTIAL/PLATELET
Basophils Absolute: 0 10*3/uL (ref 0.0–0.1)
Basophils Relative: 0 %
EOS PCT: 0 %
Eosinophils Absolute: 0 10*3/uL (ref 0.0–0.7)
HEMATOCRIT: 35.3 % — AB (ref 39.0–52.0)
HEMOGLOBIN: 11.7 g/dL — AB (ref 13.0–17.0)
LYMPHS ABS: 1.6 10*3/uL (ref 0.7–4.0)
Lymphocytes Relative: 5 %
MCH: 29.4 pg (ref 26.0–34.0)
MCHC: 33.1 g/dL (ref 30.0–36.0)
MCV: 88.7 fL (ref 78.0–100.0)
MONOS PCT: 4 %
Monocytes Absolute: 1.3 10*3/uL — ABNORMAL HIGH (ref 0.1–1.0)
NEUTROS ABS: 29 10*3/uL — AB (ref 1.7–7.7)
Neutrophils Relative %: 91 %
Platelets: 229 10*3/uL (ref 150–400)
RBC: 3.98 MIL/uL — ABNORMAL LOW (ref 4.22–5.81)
RDW: 13.3 % (ref 11.5–15.5)
WBC: 31.9 10*3/uL — ABNORMAL HIGH (ref 4.0–10.5)

## 2016-08-21 LAB — COMPREHENSIVE METABOLIC PANEL
ALK PHOS: 43 U/L (ref 38–126)
ALT: 21 U/L (ref 17–63)
AST: 40 U/L (ref 15–41)
Albumin: 3.1 g/dL — ABNORMAL LOW (ref 3.5–5.0)
Anion gap: 9 (ref 5–15)
BILIRUBIN TOTAL: 1.8 mg/dL — AB (ref 0.3–1.2)
BUN: 17 mg/dL (ref 6–20)
CHLORIDE: 103 mmol/L (ref 101–111)
CO2: 22 mmol/L (ref 22–32)
Calcium: 7.8 mg/dL — ABNORMAL LOW (ref 8.9–10.3)
Creatinine, Ser: 1.06 mg/dL (ref 0.61–1.24)
GFR calc non Af Amer: >60 mL/min (ref >60)
Glucose, Bld: 143 mg/dL — ABNORMAL HIGH (ref 65–99)
POTASSIUM: 3.7 mmol/L (ref 3.5–5.1)
Sodium: 134 mmol/L — ABNORMAL LOW (ref 135–145)
Total Protein: 6.5 g/dL (ref 6.5–8.1)

## 2016-08-21 LAB — URINALYSIS, ROUTINE W REFLEX MICROSCOPIC
BACTERIA UA: NONE SEEN
Bilirubin Urine: NEGATIVE
Glucose, UA: NEGATIVE mg/dL
HGB URINE DIPSTICK: NEGATIVE
KETONES UR: NEGATIVE mg/dL
Leukocytes, UA: NEGATIVE
Nitrite: NEGATIVE
PROTEIN: 30 mg/dL — AB
Specific Gravity, Urine: 1.024 (ref 1.005–1.030)
Squamous Epithelial / HPF: NONE SEEN
pH: 6 (ref 5.0–8.0)

## 2016-08-21 LAB — I-STAT TROPONIN, ED: Troponin i, poc: 0.02 ng/mL (ref 0.00–0.08)

## 2016-08-21 LAB — PHOSPHORUS: Phosphorus: 2 mg/dL — ABNORMAL LOW (ref 2.5–4.6)

## 2016-08-21 LAB — I-STAT CG4 LACTIC ACID, ED
Lactic Acid, Venous: 0.87 mmol/L (ref 0.5–1.9)
Lactic Acid, Venous: 1.92 mmol/L (ref 0.5–1.9)

## 2016-08-21 MED ORDER — IPRATROPIUM-ALBUTEROL 0.5-2.5 (3) MG/3ML IN SOLN
3.0000 mL | Freq: Four times a day (QID) | RESPIRATORY_TRACT | Status: DC
  Administered 2016-08-22 (×2): 3 mL via RESPIRATORY_TRACT
  Filled 2016-08-21 (×3): qty 3

## 2016-08-21 MED ORDER — ACETAMINOPHEN 500 MG PO TABS
1000.0000 mg | ORAL_TABLET | Freq: Once | ORAL | Status: AC
  Administered 2016-08-21: 1000 mg via ORAL
  Filled 2016-08-21: qty 2

## 2016-08-21 MED ORDER — DEXTROSE 5 % IV SOLN
1.0000 g | INTRAVENOUS | Status: DC
  Administered 2016-08-22 – 2016-08-23 (×2): 1 g via INTRAVENOUS
  Filled 2016-08-21 (×2): qty 10

## 2016-08-21 MED ORDER — CEFTRIAXONE SODIUM 1 G IJ SOLR
1.0000 g | Freq: Once | INTRAMUSCULAR | Status: AC
  Administered 2016-08-21: 1 g via INTRAVENOUS
  Filled 2016-08-21: qty 10

## 2016-08-21 MED ORDER — KETOROLAC TROMETHAMINE 30 MG/ML IJ SOLN
30.0000 mg | Freq: Once | INTRAMUSCULAR | Status: AC
  Administered 2016-08-21: 30 mg via INTRAVENOUS
  Filled 2016-08-21: qty 1

## 2016-08-21 MED ORDER — IPRATROPIUM-ALBUTEROL 0.5-2.5 (3) MG/3ML IN SOLN
3.0000 mL | RESPIRATORY_TRACT | Status: DC | PRN
  Administered 2016-08-21 – 2016-08-23 (×2): 3 mL via RESPIRATORY_TRACT
  Filled 2016-08-21 (×2): qty 3

## 2016-08-21 MED ORDER — METHYLPREDNISOLONE SODIUM SUCC 125 MG IJ SOLR
125.0000 mg | Freq: Once | INTRAMUSCULAR | Status: AC
  Administered 2016-08-21: 125 mg via INTRAVENOUS

## 2016-08-21 MED ORDER — SODIUM CHLORIDE 0.9 % IV BOLUS (SEPSIS)
1000.0000 mL | Freq: Once | INTRAVENOUS | Status: AC
  Administered 2016-08-21: 1000 mL via INTRAVENOUS

## 2016-08-21 MED ORDER — METHYLPREDNISOLONE SODIUM SUCC 125 MG IJ SOLR
INTRAMUSCULAR | Status: AC
  Filled 2016-08-21: qty 2

## 2016-08-21 MED ORDER — MORPHINE SULFATE (PF) 10 MG/ML IV SOLN
2.0000 mg | Freq: Once | INTRAVENOUS | Status: DC
  Filled 2016-08-21: qty 1

## 2016-08-21 MED ORDER — MAGNESIUM SULFATE 2 GM/50ML IV SOLN
2.0000 g | Freq: Once | INTRAVENOUS | Status: AC
Start: 2016-08-21 — End: 2016-08-22
  Administered 2016-08-21: 2 g via INTRAVENOUS
  Filled 2016-08-21: qty 50

## 2016-08-21 MED ORDER — AZITHROMYCIN 500 MG IV SOLR
500.0000 mg | Freq: Once | INTRAVENOUS | Status: AC
  Administered 2016-08-21: 500 mg via INTRAVENOUS
  Filled 2016-08-21: qty 500

## 2016-08-21 MED ORDER — DEXTROSE 5 % IV SOLN
500.0000 mg | INTRAVENOUS | Status: DC
  Administered 2016-08-22 – 2016-08-23 (×2): 500 mg via INTRAVENOUS
  Filled 2016-08-21 (×2): qty 500

## 2016-08-21 MED ORDER — SODIUM CHLORIDE 0.9 % IV BOLUS (SEPSIS)
500.0000 mL | Freq: Once | INTRAVENOUS | Status: AC
  Administered 2016-08-21: 500 mL via INTRAVENOUS

## 2016-08-21 NOTE — ED Triage Notes (Signed)
Brought in by EMS from with chief complaint of fever.  Pt reported that he has been having fever, generalized weakness and pain in the past 2 days.  Pt also reported shortness of breath today--- with O2 sat 92% on room air by EMS.  Pt was given NS 750 ml bolus and O2 2L/min via [place] on scene.

## 2016-08-21 NOTE — ED Provider Notes (Signed)
[facility] DEPT Provider Note   CSN: [id number] Arrival date & time: 08/21/16  2003     History   Chief Complaint Chief Complaint  Patient presents with  . Fever    HPI Nathaniel Miles is a 66 y.o. male.  The history is provided by the patient.  Cough  This is a new problem. The current episode started 2 days ago. The problem occurs constantly. The problem has been rapidly worsening. The cough is productive of sputum. Associated symptoms include chills, sweats and shortness of breath. He has tried nothing for the symptoms. He is a smoker.    Past Medical History:  Diagnosis Date  . DDD (degenerative disc disease), lumbar   . Degenerative disc disease, cervical   . Meningioma ([place])   . Squamous cell cancer of buccal mucosa [facility])     Patient Active Problem List   Diagnosis Date Noted  . Chronic bronchitis ([place]) 06/26/2015  . Smoker 06/26/2015  . Squamous cell carcinoma of mouth ([place]) 05/12/2011    History reviewed. No pertinent surgical history.     Home Medications    Prior to Admission medications   Medication Sig Start Date End Date Taking? Authorizing Provider  Cariprazine HCl 1.5 & 3 MG CPPK  06/29/16  Yes Historical Provider, MD  LamoTRIgine 25 & 50 & 100 MG KIT  06/29/16  Yes Historical Provider, MD  oxyCODONE-acetaminophen (PERCOCET/ROXICET) 5-325 MG tablet Take 1 tablet by mouth every 8 (eight) hours as needed for severe pain. Please do not exceed 4000 mg of acetaminophen (Tylenol) a 24-hour period. Please note that he may be prescribed additional medicine that contains acetaminophen. 08/12/16 08/27/16 Yes Fatima Blank, MD  venlafaxine XR (EFFEXOR-XR) 37.5 MG 24 hr capsule Take 75 mg by mouth daily with breakfast.   Yes Historical Provider, MD  albuterol (PROVENTIL HFA;VENTOLIN HFA) 108 (90 Base) MCG/ACT inhaler Inhale 2 puffs into the lungs every 6 (six) hours as needed for wheezing or shortness of breath. 07/03/15   Tresa Garter, MD  magic  mouthwash w/lidocaine SOLN Take 5 mLs by mouth 3 (three) times daily as needed for mouth pain. Patient not taking: Reported on 08/21/2016 03/20/16   Ashley Murrain, NP  nystatin (MYCOSTATIN) 100000 UNIT/ML suspension Take 5 mLs (500,000 Units total) by mouth 4 (four) times daily. Patient not taking: Reported on 08/21/2016 03/20/16   Ashley Murrain, NP    Family History History reviewed. No pertinent family history.  Social History Social History  Substance Use Topics  . Smoking status: Current Some Day Smoker  . Smokeless tobacco: Never Used  . Alcohol use No     Allergies   Penicillins and Stadol [butorphanol]   Review of Systems Review of Systems  Constitutional: Positive for chills.  Respiratory: Positive for cough and shortness of breath.   All other systems reviewed and are negative.    Physical Exam Updated Vital Signs BP 103/66 (BP Location: Right Arm)   Pulse 94   Temp 99.5 F (37.5 C) (Oral)   Resp (!) 21   SpO2 92%   Physical Exam  Constitutional: He is oriented to person, place, and time. He appears well-developed and well-nourished. He appears ill. No distress.  HENT:  Head: Normocephalic and atraumatic.  Nose: Nose normal.  Eyes: Conjunctivae are normal.  Neck: Neck supple. No tracheal deviation present.  Cardiovascular: Normal rate, regular rhythm and normal heart sounds.   Pulmonary/Chest: Effort normal and breath sounds normal. Tachypnea noted. No respiratory distress.  Abdominal:  Soft. He exhibits no distension.  Neurological: He is alert and oriented to person, place, and time.  Skin: Skin is warm and dry.  Psychiatric: He has a normal mood and affect.  Vitals reviewed.    ED Treatments / Results  Labs (all labs ordered are listed, but only abnormal results are displayed) Labs Reviewed  CBC WITH DIFFERENTIAL/PLATELET - Abnormal; Notable for the following:       Result Value   WBC 31.9 (*)    RBC 3.98 (*)    Hemoglobin 11.7 (*)    HCT 35.3  (*)    All other components within normal limits  I-STAT CG4 LACTIC ACID, ED - Abnormal; Notable for the following:    Lactic Acid, Venous 1.92 (*)    All other components within normal limits  CULTURE, BLOOD (ROUTINE X 2)  CULTURE, BLOOD (ROUTINE X 2)  COMPREHENSIVE METABOLIC PANEL  URINALYSIS, ROUTINE W REFLEX MICROSCOPIC    EKG  EKG Interpretation None       Radiology Dg Chest 2 View  Result Date: 08/21/2016 CLINICAL DATA:  66 year old male with cough shortness of breath and fever. Smoker. EXAM: CHEST  2 VIEW COMPARISON:  Chest radiographs 06/25/2015 FINDINGS: 6-8 cm masslike area of opacity posterior to the right hilum in the superior segment of the right lower lobe. Visible hilar contour appears stable. Other mediastinal contours are stable. No pleural effusion. No pneumothorax or pulmonary edema. Stable left lung. No bony destruction or acute osseous abnormality identified. Negative visible bowel gas pattern. IMPRESSION: Mass like 6-8 cm opacity in the superior segment of the right lower lobe posteriorly. Main differential considerations are Bronchogenic Carcinoma and Pneumonia. No pleural effusion identified. If pneumonia is suspected clinically then follow-up with PA and lateral chest X-ray in 3-4 weeks following antibiotic therapy. Otherwise Chest CT (IV contrast preferred) is recommended. Electronically Signed   By: Genevie Ann M.D.   On: 08/21/2016 21:06    Procedures Procedures (including critical care time)  Medications Ordered in ED Medications  acetaminophen (TYLENOL) tablet 1,000 mg (not administered)  cefTRIAXone (ROCEPHIN) 1 g in dextrose 5 % 50 mL IVPB (not administered)  azithromycin (ZITHROMAX) 500 mg in dextrose 5 % 250 mL IVPB (not administered)  sodium chloride 0.9 % bolus 1,000 mL (not administered)    And  sodium chloride 0.9 % bolus 1,000 mL (not administered)    And  sodium chloride 0.9 % bolus 500 mL (not administered)     Initial Impression /  Assessment and Plan / ED Course  I have reviewed the triage vital signs and the nursing notes.  Pertinent labs & imaging results that were available during my care of the patient were reviewed by me and considered in my medical decision making (see chart for details).     66 y.o. male presents with progressive shortness of breath and cough that has rapidly worsened over last few days. Has focal bacterial pneumonia of RML, covered with rocephin and azithro for CAP sepsis with WBC dramatically elevated. Fluid resuscitated. Hospitalist was consulted for admission and will see the patient in the emergency department.   Final Clinical Impressions(s) / ED Diagnoses   Final diagnoses:  Community acquired pneumonia of right middle lobe of lung ([place])  Sepsis, due to unspecified organism [facility])    New Prescriptions New Prescriptions   No medications on file     Leo Grosser, MD 08/22/16 [phone]

## 2016-08-21 NOTE — Progress Notes (Signed)
Pharmacy Antibiotic Note  Nathaniel Miles is a 66 y.o. male admitted on 08/21/2016 with pneumonia.  Pharmacy has been consulted for ceftriaxone and azithromycin dosing. Of note, patient reports penicillin allergy, but EDP notes that patient has tolerated keflex in the past without an allergic reaction.  Plan:  Ceftriaxone 1g IV x 24h x 7 days  Azithromycin 500mg  IV q24h x 7 days  No further dose adjustments needed, Pharmacy will sign off    Temp (24hrs), Avg:99.5 F (37.5 C), Min:99.5 F (37.5 C), Max:99.5 F (37.5 C)   Recent Labs Lab 08/21/16 2036 08/21/16 2043  WBC 31.9*  --   CREATININE 1.06  --   LATICACIDVEN  --  1.92*    CrCl cannot be calculated (Unknown ideal weight.).    Allergies  Allergen Reactions  . Penicillins Anaphylaxis    Has patient had a PCN reaction causing immediate rash, facial/tongue/throat swelling, SOB or lightheadedness with hypotension: Yes Has patient had a PCN reaction causing severe rash involving mucus membranes or skin necrosis: No Has patient had a PCN reaction that required hospitalization Yes Has patient had a PCN reaction occurring within the last 10 years: No If all of the above answers are "NO", then may proceed with Cephalosporin use. *Per EDP, patient has tolerated Keflex in the past*   . Stadol [Butorphanol] Anaphylaxis    Antimicrobials this admission: 4/13 Ceftriaxone >> 4/13 Azithromycin  Dose adjustments this admission: none  Microbiology results: 4/13 BCx: sent  Thank you for allowing pharmacy to be a part of this patient's care.  Peggyann Juba, PharmD, BCPS Pager: [phone] 08/21/2016 9:22 PM

## 2016-08-21 NOTE — ED Notes (Signed)
Hospitalist made aware of pt. I-stat CG4 Lactic acid results and I-stat troponin.

## 2016-08-21 NOTE — ED Notes (Signed)
Bed: SJ62 Expected date:  Expected time:  Means of arrival:  Comments: 66 yo M  Fever

## 2016-08-22 ENCOUNTER — Encounter (HOSPITAL_COMMUNITY): Payer: Self-pay | Admitting: Internal Medicine

## 2016-08-22 ENCOUNTER — Inpatient Hospital Stay (HOSPITAL_COMMUNITY): Payer: Medicaid Other

## 2016-08-22 DIAGNOSIS — R0689 Other abnormalities of breathing: Secondary | ICD-10-CM | POA: Diagnosis present

## 2016-08-22 DIAGNOSIS — R9389 Abnormal findings on diagnostic imaging of other specified body structures: Secondary | ICD-10-CM | POA: Diagnosis present

## 2016-08-22 DIAGNOSIS — J181 Lobar pneumonia, unspecified organism: Secondary | ICD-10-CM

## 2016-08-22 DIAGNOSIS — R0781 Pleurodynia: Secondary | ICD-10-CM | POA: Diagnosis present

## 2016-08-22 DIAGNOSIS — J441 Chronic obstructive pulmonary disease with (acute) exacerbation: Secondary | ICD-10-CM | POA: Diagnosis present

## 2016-08-22 DIAGNOSIS — J189 Pneumonia, unspecified organism: Secondary | ICD-10-CM

## 2016-08-22 DIAGNOSIS — A419 Sepsis, unspecified organism: Secondary | ICD-10-CM | POA: Diagnosis present

## 2016-08-22 LAB — RETICULOCYTES
RBC.: 4.15 MIL/uL — AB (ref 4.22–5.81)
RETIC COUNT ABSOLUTE: 49.8 10*3/uL (ref 19.0–186.0)
RETIC CT PCT: 1.2 % (ref 0.4–3.1)

## 2016-08-22 LAB — CBC WITH DIFFERENTIAL/PLATELET
Basophils Absolute: 0 10*3/uL (ref 0.0–0.1)
Basophils Relative: 0 %
EOS PCT: 0 %
Eosinophils Absolute: 0 10*3/uL (ref 0.0–0.7)
HEMATOCRIT: 37.7 % — AB (ref 39.0–52.0)
Hemoglobin: 12 g/dL — ABNORMAL LOW (ref 13.0–17.0)
LYMPHS ABS: 0.7 10*3/uL (ref 0.7–4.0)
Lymphocytes Relative: 2 %
MCH: 28.8 pg (ref 26.0–34.0)
MCHC: 31.8 g/dL (ref 30.0–36.0)
MCV: 90.6 fL (ref 78.0–100.0)
MONOS PCT: 1 %
Monocytes Absolute: 0.3 10*3/uL (ref 0.1–1.0)
NEUTROS ABS: 32 10*3/uL — AB (ref 1.7–7.7)
Neutrophils Relative %: 97 %
Platelets: 198 10*3/uL (ref 150–400)
RBC: 4.16 MIL/uL — ABNORMAL LOW (ref 4.22–5.81)
RDW: 13.7 % (ref 11.5–15.5)
WBC: 33 10*3/uL — ABNORMAL HIGH (ref 4.0–10.5)

## 2016-08-22 LAB — IRON AND TIBC
Iron: 7 ug/dL — ABNORMAL LOW (ref 45–182)
SATURATION RATIOS: 3 % — AB (ref 17.9–39.5)
TIBC: 252 ug/dL (ref 250–450)
UIBC: 245 ug/dL

## 2016-08-22 LAB — STREP PNEUMONIAE URINARY ANTIGEN: Strep Pneumo Urinary Antigen: NEGATIVE

## 2016-08-22 LAB — FERRITIN: Ferritin: 195 ng/mL (ref 24–336)

## 2016-08-22 LAB — VITAMIN B12: Vitamin B-12: 288 pg/mL (ref 180–914)

## 2016-08-22 LAB — FOLATE: Folate: 14.2 ng/mL (ref >5.9)

## 2016-08-22 LAB — HIV ANTIBODY (ROUTINE TESTING W REFLEX): HIV Screen 4th Generation wRfx: NONREACTIVE

## 2016-08-22 MED ORDER — HEPARIN SODIUM (PORCINE) 5000 UNIT/ML IJ SOLN
5000.0000 [IU] | Freq: Three times a day (TID) | INTRAMUSCULAR | Status: DC
  Administered 2016-08-22: 5000 [IU] via SUBCUTANEOUS
  Filled 2016-08-22 (×3): qty 1

## 2016-08-22 MED ORDER — IPRATROPIUM-ALBUTEROL 0.5-2.5 (3) MG/3ML IN SOLN: 3.0000 mL | Freq: Three times a day (TID) | RESPIRATORY_TRACT | Status: DC

## 2016-08-22 MED ORDER — SODIUM CHLORIDE 0.9 % IV SOLN
INTRAVENOUS | Status: DC
  Administered 2016-08-22 – 2016-08-23 (×3): via INTRAVENOUS

## 2016-08-22 MED ORDER — SODIUM CHLORIDE 0.9 % IV BOLUS (SEPSIS)
1000.0000 mL | Freq: Once | INTRAVENOUS | Status: AC
  Administered 2016-08-22: 1000 mL via INTRAVENOUS

## 2016-08-22 MED ORDER — ONDANSETRON HCL 4 MG PO TABS: 4.0000 mg | ORAL_TABLET | Freq: Four times a day (QID) | ORAL | Status: DC | PRN

## 2016-08-22 MED ORDER — CARIPRAZINE HCL 1.5 & 3 MG PO CPPK: 1.5000 mg | ORAL_CAPSULE | Freq: Every day | ORAL | Status: DC

## 2016-08-22 MED ORDER — POTASSIUM CHLORIDE CRYS ER 20 MEQ PO TBCR
40.0000 meq | EXTENDED_RELEASE_TABLET | Freq: Once | ORAL | Status: AC
  Administered 2016-08-22: 40 meq via ORAL
  Filled 2016-08-22: qty 2

## 2016-08-22 MED ORDER — K PHOS MONO-SOD PHOS DI & MONO 155-852-130 MG PO TABS
500.0000 mg | ORAL_TABLET | Freq: Three times a day (TID) | ORAL | Status: AC
  Administered 2016-08-22 (×2): 500 mg via ORAL
  Filled 2016-08-22 (×4): qty 2

## 2016-08-22 MED ORDER — LAMOTRIGINE 25 MG PO TABS
25.0000 mg | ORAL_TABLET | Freq: Two times a day (BID) | ORAL | Status: DC
Start: 2016-08-22 — End: 2016-08-24
  Administered 2016-08-22 – 2016-08-24 (×5): 25 mg via ORAL
  Filled 2016-08-22 (×4): qty 1

## 2016-08-22 MED ORDER — IOPAMIDOL (ISOVUE-300) INJECTION 61%
INTRAVENOUS | Status: AC
  Filled 2016-08-22: qty 75

## 2016-08-22 MED ORDER — OXYCODONE-ACETAMINOPHEN 5-325 MG PO TABS
1.0000 | ORAL_TABLET | Freq: Three times a day (TID) | ORAL | Status: DC | PRN
  Administered 2016-08-22 – 2016-08-24 (×5): 1 via ORAL
  Filled 2016-08-22 (×5): qty 1

## 2016-08-22 MED ORDER — VENLAFAXINE HCL ER 75 MG PO CP24
75.0000 mg | ORAL_CAPSULE | Freq: Every day | ORAL | Status: DC
  Administered 2016-08-23 – 2016-08-24 (×2): 75 mg via ORAL
  Filled 2016-08-22 (×2): qty 1

## 2016-08-22 MED ORDER — GUAIFENESIN ER 600 MG PO TB12
1200.0000 mg | ORAL_TABLET | Freq: Two times a day (BID) | ORAL | Status: DC
  Administered 2016-08-22 – 2016-08-24 (×5): 1200 mg via ORAL
  Filled 2016-08-22 (×5): qty 2

## 2016-08-22 MED ORDER — IOPAMIDOL (ISOVUE-300) INJECTION 61%
75.0000 mL | Freq: Once | INTRAVENOUS | Status: AC | PRN
  Administered 2016-08-22: 75 mL via INTRAVENOUS

## 2016-08-22 MED ORDER — CARIPRAZINE HCL 1.5 MG PO CAPS
1.5000 mg | ORAL_CAPSULE | Freq: Every day | ORAL | Status: DC
  Administered 2016-08-22 – 2016-08-23 (×2): 1.5 mg via ORAL
  Filled 2016-08-22 (×2): qty 1

## 2016-08-22 MED ORDER — ONDANSETRON HCL 4 MG/2ML IJ SOLN
4.0000 mg | Freq: Four times a day (QID) | INTRAMUSCULAR | Status: DC | PRN
  Administered 2016-08-22: 4 mg via INTRAVENOUS
  Filled 2016-08-22: qty 2

## 2016-08-22 MED ORDER — GUAIFENESIN-DM 100-10 MG/5ML PO SYRP: 5.0000 mL | ORAL_SOLUTION | ORAL | Status: DC | PRN

## 2016-08-22 NOTE — ED Notes (Signed)
I was in the patient room to collect blood and after patient ask me when he was going to get a room I explain to patient that at this time I did not know when he would get one and as soon as we knew we would let him know and take him upstairs.  He stated I was being rude I tried to explained to him that I am sorry that he thought that  however I was  just trying to answer his question.

## 2016-08-22 NOTE — ED Notes (Signed)
Patient spoke to charge nurse, he was very upset with the care given on night shift.  He became upset and cursing everyone that walked in the room.  Patient requested to be moved to [facility] or another hospital.  Hospitalist informed patient he would not transfer him and if he left he would leave AMA.

## 2016-08-22 NOTE — ED Notes (Signed)
PATIENT GIVEN HIS BREAKFAST TRAY

## 2016-08-22 NOTE — Progress Notes (Signed)
Pt refuesd both neb tx 08:00 and 14:00. No distress noted at this time.

## 2016-08-22 NOTE — H&P (Signed)
History and Physical    Nathaniel Miles IHK:[id number] DOB: July 22, 1950 DOA: 08/21/2016  PCP: Carmie Kanner, NP   Patient coming from: Home.  I have personally briefly reviewed patient's old medical records in [place]  Chief Complaint: Generalized weakness and fever.  HPI: Nathaniel Miles is a 66 y.o. 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 He denies sick contacts or travel history. Symptoms are very similar to the time that he had a community-acquired pneumonia about a year or so ago.  ED Course: The patient received 4 L of NS bolus, ceftriaxone 1 g and azithromycin IVPB. When I initially saw him, the patient was complaining of severe pleuritic chest pain, dyspneic, wheezing and mildly hypoxic on nasal cannula oxygen. This improved significantly with a DuoNeb, NRBM oxygen, Toradol 30 mg and Solu-Medrol 125 mg IVP.  His workup showed WBC of 31.9, hemoglobin 11.7 g/dL and platelets of 229. Lactic acid 1.92, sodium 134, potassium 3.7, chloride 103 and bicarbonate 22 mmol/L. BUN was 17, creatinine 1.06 and glucose 143 mg/dL. His chest radiograph shows a masslike opacity 6-8 cm on the superior segment of the right lower lobe posteriorly.  Review of Systems: As per HPI otherwise 10 point review of systems negative.    Past Medical History:  Diagnosis Date  . DDD (degenerative disc disease), lumbar   . Degenerative disc disease, cervical   . Meningioma ([place])   . 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 No pertinent surgical history.   reports that he has been smoking.  He has never used smokeless tobacco. He reports that he  does not drink alcohol or use drugs.  Allergies  Allergen Reactions  . Penicillins Anaphylaxis    Has patient had a PCN reaction causing immediate rash, facial/tongue/throat swelling, SOB or lightheadedness with hypotension: Yes Has patient had a PCN reaction causing severe rash involving mucus membranes or skin necrosis: No Has patient had a PCN reaction that required hospitalization Yes Has patient had a PCN reaction occurring within the last 10 years: No If all of the above answers are "NO", then may proceed with Cephalosporin use. *Per EDP, patient has tolerated Keflex in the past*   . Stadol [Butorphanol] Anaphylaxis    Family History  Problem Relation Age of Onset  . Hypertension Other     Prior to Admission medications   Medication Sig Start Date End Date Taking? Authorizing Provider  Cariprazine HCl 1.5 & 3 MG CPPK  06/29/16  Yes Historical Provider, MD  LamoTRIgine 25 & 50 & 100 MG KIT  06/29/16  Yes Historical Provider, MD  oxyCODONE-acetaminophen (PERCOCET/ROXICET) 5-325 MG tablet Take 1 tablet by mouth every 8 (eight) hours as needed for severe pain. Please do not exceed 4000 mg of acetaminophen (Tylenol) a 24-hour period. Please note that he may be prescribed additional medicine that contains acetaminophen. 08/12/16 08/27/16 Yes Fatima Blank, MD  venlafaxine XR (EFFEXOR-XR) 37.5 MG 24 hr capsule Take 75 mg by mouth daily with breakfast.   Yes Historical Provider, MD  albuterol (PROVENTIL HFA;VENTOLIN HFA) 108 (90 Base) MCG/ACT inhaler Inhale 2 puffs into the lungs every 6 (six) hours as needed for wheezing or shortness of  breath. 07/03/15   Tresa Garter, MD  magic mouthwash w/lidocaine SOLN Take 5 mLs by mouth 3 (three) times daily as needed for mouth pain. Patient not taking: Reported on 08/21/2016 03/20/16   Ashley Murrain, NP  nystatin (MYCOSTATIN) 100000 UNIT/ML suspension Take 5 mLs (500,000 Units total) by mouth 4 (four) times daily. Patient not taking:  Reported on 08/21/2016 03/20/16   Ashley Murrain, NP    Physical Exam:  Constitutional: Looks acutely ill. Vitals:   08/22/16 0030 08/22/16 0057 08/22/16 0128 08/22/16 0200  BP:  100/64  (!) 98/50  Pulse: 79 76  66  Resp: (!) 22 20  (!) 22  Temp:      TempSrc:      SpO2: 97% 98% 100% 95%   Eyes: PERRL, lids and conjunctivae normal ENMT: Mucous membranes are dry. Posterior pharynx clear of any exudate or lesions. Neck: normal, supple, no masses, no thyromegaly Respiratory: Tachypneic, decreased breath sounds bilaterally with wheezing and rhonchi, no accessory muscle use.  Cardiovascular: Regular rate and rhythm, no murmurs / rubs / gallops. No extremity edema. 2+ pedal pulses. No carotid bruits.  Abdomen: Soft, no tenderness, no masses palpated. No hepatosplenomegaly. Bowel sounds positive.  Musculoskeletal: no clubbing / cyanosis. Good ROM, no contractures. Normal muscle tone.  Skin: no significant rashes, lesions, ulcers on limited skin exam. Neurologic: CN 2-12 grossly intact. Sensation intact, DTR normal. Strength 5/5 in all 4.  Psychiatric: Normal judgment and insight. Alert and oriented x 3. Normal mood.    Labs on Admission: I have personally reviewed following labs and imaging studies  CBC:  Recent Labs Lab 08/21/16 2036  WBC 31.9*  NEUTROABS 29.0*  HGB 11.7*  HCT 35.3*  MCV 88.7  PLT 784   Basic Metabolic Panel:  Recent Labs Lab 08/21/16 2036  NA 134*  K 3.7  CL 103  CO2 22  GLUCOSE 143*  BUN 17  CREATININE 1.06  CALCIUM 7.8*  PHOS 2.0*   GFR: CrCl cannot be calculated (Unknown ideal weight.). Liver Function Tests:  Recent Labs Lab 08/21/16 2036  AST 40  ALT 21  ALKPHOS 43  BILITOT 1.8*  PROT 6.5  ALBUMIN 3.1*   No results for input(s): LIPASE, AMYLASE in the last 168 hours. No results for input(s): AMMONIA in the last 168 hours. Coagulation Profile: No results for input(s): INR, PROTIME in the last 168 hours. Cardiac Enzymes: No results  for input(s): CKTOTAL, CKMB, CKMBINDEX, TROPONINI in the last 168 hours. BNP (last 3 results) No results for input(s): PROBNP in the last 8760 hours. HbA1C: No results for input(s): HGBA1C in the last 72 hours. CBG: No results for input(s): GLUCAP in the last 168 hours. Lipid Profile: No results for input(s): CHOL, HDL, LDLCALC, TRIG, CHOLHDL, LDLDIRECT in the last 72 hours. Thyroid Function Tests: No results for input(s): TSH, T4TOTAL, FREET4, T3FREE, THYROIDAB in the last 72 hours. Anemia Panel: No results for input(s): VITAMINB12, FOLATE, FERRITIN, TIBC, IRON, RETICCTPCT in the last 72 hours. Urine analysis:    Component Value Date/Time   COLORURINE YELLOW 08/21/2016 2255   APPEARANCEUR CLEAR 08/21/2016 2255   LABSPEC 1.024 08/21/2016 2255   PHURINE 6.0 08/21/2016 2255   GLUCOSEU NEGATIVE 08/21/2016 2255   HGBUR NEGATIVE 08/21/2016 2255   BILIRUBINUR NEGATIVE 08/21/2016 2255   KETONESUR NEGATIVE 08/21/2016 2255   PROTEINUR 30 (A) 08/21/2016 2255   NITRITE NEGATIVE 08/21/2016 2255   LEUKOCYTESUR NEGATIVE 08/21/2016 2255    Radiological Exams on Admission: Dg Chest 2 View  Result Date: 08/21/2016 CLINICAL DATA:  66 year old male with cough shortness of breath and fever. Smoker. EXAM: CHEST  2 VIEW COMPARISON:  Chest radiographs 06/25/2015 FINDINGS: 6-8 cm masslike area of opacity posterior to the right hilum in the superior segment of the right lower lobe. Visible hilar contour appears stable. Other mediastinal contours are stable. No pleural effusion. No pneumothorax or pulmonary edema. Stable left lung. No bony destruction or acute osseous abnormality identified. Negative visible bowel gas pattern. IMPRESSION: Mass like 6-8 cm opacity in the superior segment of the right lower lobe posteriorly. Main differential considerations are Bronchogenic Carcinoma and Pneumonia. No pleural effusion identified. If pneumonia is suspected clinically then follow-up with PA and lateral chest X-ray  in 3-4 weeks following antibiotic therapy. Otherwise Chest CT (IV contrast preferred) is recommended. Electronically Signed   By: Genevie Ann M.D.   On: 08/21/2016 21:06    EKG: Independently reviewed. Vent. rate 75 BPM PR interval * ms QRS duration 106 ms QT/QTc 390/436 ms P-R-T axes 70 -44 57 Sinus rhythm Left anterior fascicular block Borderline low voltage, extremity leads  Assessment/Plan Principal Problem:   CAP (community acquired pneumonia)   Sepsis due to pneumonia ([facility]) Admit to stepdown/inpatient. Continue supplemental oxygen. Continue COPD exacerbation treatment. Continue azithromycin 500 mg IVPB every 24 hours. Continue ceftriaxone 1 g IVPB every 24 hours. Follow-up blood cultures and sensitivity. Check a strep pneumoniae urinary antigen.  Active Problems:   Abnormal chest x-ray Given history of smoking will do CT chest with contrast. Consider evaluation by pulmonology if CT results are suspicious for lung cancer.    Respiratory insufficiency Continue supplemental oxygen. BiPAP ventilation as needed.    COPD exacerbation (HCC) Continue supplemental oxygen. Continue scheduled and as needed bronchodilators. Single dose of Solu-Medrol 125 mg IVP given. Magnesium sulfate 2 g IVPB 1 dose. Smoking cessation suggested.    Anemia Check anemia panel. Monitor hematocrit and hemoglobin.    Pleuritic chest pain Analgesics as needed. Continue treatment for pneumonia.    Hypophosphatemia Will replace orally.    Hyperglycemia Monitor blood glucose.    DVT prophylaxis: Lovenox SQ. Code Status: Full code. Family Communication:  Disposition Plan: Admit for IV antibiotics for several days. Consults called:  Admission status: Inpatient/stepdown.   Reubin Milan MD [facility] Pager [phone].  If 7PM-7AM, please contact night-coverage [email or web address] Password [email or web address]  08/22/2016, 2:09 AM

## 2016-08-22 NOTE — Progress Notes (Signed)
PROGRESS NOTE  Nathaniel Miles  OYD:[id number] DOB: 06-04-1950 DOA: 08/21/2016 PCP: Carmie Kanner, NP Outpatient Specialists:  Subjective: Patient was very upset this morning. He was in the bathroom, he took the IV fluids empty bags with him to the bathroom. He complained that "everybody is rude and I want to be transferred to [facility]". I explained to him that transfer to [facility] will be very difficult unless there is something we cannot do here. Also explained to him I cannot discharge him as he is still appears to be sick and septic from pneumonia.  Brief Narrative:  Nathaniel Miles is a 66 y.o. 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 He denies sick contacts or travel history. Symptoms are very similar to the time that he had a community-acquired pneumonia about a year or so ago.  Assessment & Plan:   Principal Problem:   CAP (community acquired pneumonia) Active Problems:   Anemia   Pleuritic chest pain   Respiratory insufficiency   COPD exacerbation (HCC)   Sepsis due to pneumonia ([place])   Hypophosphatemia   Abnormal chest x-ray   This is a no charge note, patient seen earlier today by my colleague Dr. Olevia Bowens. Patient admitted with sepsis secondary to community acquired pneumonia and COPD exacerbation. Questionable right-sided mass per CXR, CT showed pneumonia.   CAP (community acquired pneumonia)   Sepsis due to pneumonia ([facility]) Admit to stepdown/inpatient. Continue supplemental oxygen. Continue COPD exacerbation treatment. Continue azithromycin 500 mg IVPB every 24 hours. Continue ceftriaxone 1 g IVPB every 24 hours. Follow-up blood cultures and  sensitivity. Check a strep pneumoniae urinary antigen.  Active Problems:   Abnormal chest x-ray Given history of smoking will do CT chest with contrast. Consider evaluation by pulmonology if CT results are suspicious for lung cancer.    Respiratory insufficiency Continue supplemental oxygen. BiPAP ventilation as needed.    COPD exacerbation (HCC) Continue supplemental oxygen. Continue scheduled and as needed bronchodilators. Single dose of Solu-Medrol 125 mg IVP given. Magnesium sulfate 2 g IVPB 1 dose. Smoking cessation suggested.    Anemia Check anemia panel. Monitor hematocrit and hemoglobin.    Pleuritic chest pain Analgesics as needed. Continue treatment for pneumonia.    Hypophosphatemia Will replace orally.    Hyperglycemia Monitor blood glucose.  DVT prophylaxis:  Code Status: Prior Family Communication:  Disposition Plan:  Diet: Diet Heart Room service appropriate? Yes; Fluid consistency: Thin  Consultants:   None  Procedures:   None  Antimicrobials:   None   Objective: Vitals:   08/22/16 0600 08/22/16 0700 08/22/16 0924 08/22/16 1000  BP: (!) 90/50 107/66 109/62 98/68  Pulse: 60 70 64 76  Resp: (!) 36 18 20 19   Temp:   97.7 F (36.5 C)   TempSrc:   Oral   SpO2: 100% 93% 92% 90%    Intake/Output Summary (Last 24 hours) at 08/22/16 1145 Last data filed at 08/22/16 0158  Gross per 24 hour  Intake             6150 ml  Output                0 ml  Net  6150 ml   There were no vitals filed for this visit.  Examination: General exam: Appears calm and comfortable  Respiratory system: Clear to auscultation. Respiratory effort normal. Cardiovascular system: S1 & S2 heard, RRR. No JVD, murmurs, rubs, gallops or clicks. No pedal edema. Gastrointestinal system: Abdomen is nondistended, soft and nontender. No organomegaly or masses felt. Normal bowel sounds heard. Central nervous system: Alert and oriented. No focal  neurological deficits. Extremities: Symmetric 5 x 5 power. Skin: No rashes, lesions or ulcers Psychiatry: Judgement and insight appear normal. Mood & affect appropriate.   Data Reviewed: I have personally reviewed following labs and imaging studies  CBC:  Recent Labs Lab 08/21/16 2036 08/22/16 0600  WBC 31.9* 33.0*  NEUTROABS 29.0* 32.0*  HGB 11.7* 12.0*  HCT 35.3* 37.7*  MCV 88.7 90.6  PLT 229 010   Basic Metabolic Panel:  Recent Labs Lab 08/21/16 2036  NA 134*  K 3.7  CL 103  CO2 22  GLUCOSE 143*  BUN 17  CREATININE 1.06  CALCIUM 7.8*  PHOS 2.0*   GFR: CrCl cannot be calculated (Unknown ideal weight.). Liver Function Tests:  Recent Labs Lab 08/21/16 2036  AST 40  ALT 21  ALKPHOS 43  BILITOT 1.8*  PROT 6.5  ALBUMIN 3.1*   No results for input(s): LIPASE, AMYLASE in the last 168 hours. No results for input(s): AMMONIA in the last 168 hours. Coagulation Profile: No results for input(s): INR, PROTIME in the last 168 hours. Cardiac Enzymes: No results for input(s): CKTOTAL, CKMB, CKMBINDEX, TROPONINI in the last 168 hours. BNP (last 3 results) No results for input(s): PROBNP in the last 8760 hours. HbA1C: No results for input(s): HGBA1C in the last 72 hours. CBG: No results for input(s): GLUCAP in the last 168 hours. Lipid Profile: No results for input(s): CHOL, HDL, LDLCALC, TRIG, CHOLHDL, LDLDIRECT in the last 72 hours. Thyroid Function Tests: No results for input(s): TSH, T4TOTAL, FREET4, T3FREE, THYROIDAB in the last 72 hours. Anemia Panel:  Recent Labs  08/22/16 0600  VITAMINB12 288  FOLATE 14.2  FERRITIN 195  TIBC 252  IRON 7*  RETICCTPCT 1.2   Urine analysis:    Component Value Date/Time   COLORURINE YELLOW 08/21/2016 2255   APPEARANCEUR CLEAR 08/21/2016 2255   LABSPEC 1.024 08/21/2016 2255   PHURINE 6.0 08/21/2016 2255   GLUCOSEU NEGATIVE 08/21/2016 2255   HGBUR NEGATIVE 08/21/2016 2255   BILIRUBINUR NEGATIVE 08/21/2016 2255     KETONESUR NEGATIVE 08/21/2016 2255   PROTEINUR 30 (A) 08/21/2016 2255   NITRITE NEGATIVE 08/21/2016 2255   LEUKOCYTESUR NEGATIVE 08/21/2016 2255   Sepsis Labs: @LABRCNTIP (procalcitonin:4,lacticidven:4)  )No results found for this or any previous visit (from the past 240 hour(s)).   Invalid input(s): PROCALCITONIN, LACTICACIDVEN   Radiology Studies: Dg Chest 2 View  Result Date: 08/21/2016 CLINICAL DATA:  66 year old male with cough shortness of breath and fever. Smoker. EXAM: CHEST  2 VIEW COMPARISON:  Chest radiographs 06/25/2015 FINDINGS: 6-8 cm masslike area of opacity posterior to the right hilum in the superior segment of the right lower lobe. Visible hilar contour appears stable. Other mediastinal contours are stable. No pleural effusion. No pneumothorax or pulmonary edema. Stable left lung. No bony destruction or acute osseous abnormality identified. Negative visible bowel gas pattern. IMPRESSION: Mass like 6-8 cm opacity in the superior segment of the right lower lobe posteriorly. Main differential considerations are Bronchogenic Carcinoma and Pneumonia. No pleural effusion identified. If pneumonia is suspected clinically then follow-up with PA and lateral chest  X-ray in 3-4 weeks following antibiotic therapy. Otherwise Chest CT (IV contrast preferred) is recommended. Electronically Signed   By: Genevie Ann M.D.   On: 08/21/2016 21:06   Ct Chest W Contrast  Result Date: 08/22/2016 CLINICAL DATA:  Acute onset of generalized weakness and generalized chest pain. Shortness of breath. Decreased O2 saturation. EXAM: CT CHEST WITH CONTRAST TECHNIQUE: Multidetector CT imaging of the chest was performed during intravenous contrast administration. CONTRAST:  71mL ISOVUE-300 IOPAMIDOL (ISOVUE-300) INJECTION 61% COMPARISON:  Chest radiograph performed 08/21/2016 FINDINGS: Cardiovascular: The heart is normal in size. Scattered coronary artery calcifications seen. Scattered calcification is noted along  the thoracic aorta and proximal great vessels. Mediastinum/Nodes: Scattered mediastinal nodes remain borderline normal in size. No pericardial effusion is identified. The thyroid gland is unremarkable. No axillary lymphadenopathy is seen. Lungs/Pleura: Dense right lower lobe airspace opacification most likely reflects pneumonia. Mild left basilar airspace opacity is also seen. No pleural effusion or pneumothorax is identified. Mild scarring is noted at the right lung apex. No masses are identified. Upper Abdomen: The visualized portions of the liver and spleen are unremarkable. Nonspecific perinephric stranding is noted bilaterally. The visualized portions of the adrenal glands are unremarkable. Musculoskeletal: No acute osseous abnormalities are identified. The visualized musculature is unremarkable in appearance. IMPRESSION: 1. Dense right lower lobe airspace opacity most likely reflects pneumonia. Mild left basilar airspace opacity also seen. Followup PA and lateral chest X-ray is recommended in 3-4 weeks following trial of antibiotic therapy to ensure resolution and exclude underlying malignancy. 2. Scattered coronary artery calcifications seen. 3. Mild scarring at the right lung apex. Electronically Signed   By: Garald Balding M.D.   On: 08/22/2016 03:07        Scheduled Meds: . Cariprazine HCl  1.5 mg Oral QHS  . heparin  5,000 Units Subcutaneous Q8H  . iopamidol      . ipratropium-albuterol  3 mL Nebulization Q6H  . LamoTRIgine  25 mg Oral BID  . phosphorus  500 mg Oral TID  . venlafaxine XR  75 mg Oral Q breakfast   Continuous Infusions: . azithromycin    . cefTRIAXone (ROCEPHIN)  IV       LOS: 1 day    Time spent: 35 minutes    Tauni Sanks A, MD [facility] Pager [phone]  If 7PM-7AM, please contact night-coverage [email or web address] Password [facility] 08/22/2016, 11:45 AM

## 2016-08-22 NOTE — ED Notes (Signed)
Patient transported to CT 

## 2016-08-23 DIAGNOSIS — J441 Chronic obstructive pulmonary disease with (acute) exacerbation: Secondary | ICD-10-CM

## 2016-08-23 DIAGNOSIS — R938 Abnormal findings on diagnostic imaging of other specified body structures: Secondary | ICD-10-CM

## 2016-08-23 DIAGNOSIS — A419 Sepsis, unspecified organism: Principal | ICD-10-CM

## 2016-08-23 DIAGNOSIS — R0781 Pleurodynia: Secondary | ICD-10-CM

## 2016-08-23 LAB — CBC WITH DIFFERENTIAL/PLATELET
BASOS ABS: 0 10*3/uL (ref 0.0–0.1)
Basophils Relative: 0 %
EOS ABS: 0 10*3/uL (ref 0.0–0.7)
EOS PCT: 0 %
HCT: 32.3 % — ABNORMAL LOW (ref 39.0–52.0)
Hemoglobin: 10.6 g/dL — ABNORMAL LOW (ref 13.0–17.0)
LYMPHS ABS: 2.5 10*3/uL (ref 0.7–4.0)
Lymphocytes Relative: 10 %
MCH: 29.6 pg (ref 26.0–34.0)
MCHC: 32.8 g/dL (ref 30.0–36.0)
MCV: 90.2 fL (ref 78.0–100.0)
MONO ABS: 1.1 10*3/uL — AB (ref 0.1–1.0)
MONOS PCT: 4 %
NEUTROS ABS: 22.7 10*3/uL — AB (ref 1.7–7.7)
Neutrophils Relative %: 86 %
Platelets: 174 10*3/uL (ref 150–400)
RBC: 3.58 MIL/uL — ABNORMAL LOW (ref 4.22–5.81)
RDW: 13.6 % (ref 11.5–15.5)
WBC: 26.3 10*3/uL — ABNORMAL HIGH (ref 4.0–10.5)

## 2016-08-23 LAB — RENAL FUNCTION PANEL
Albumin: 2.6 g/dL — ABNORMAL LOW (ref 3.5–5.0)
Anion gap: 8 (ref 5–15)
BUN: 20 mg/dL (ref 6–20)
CALCIUM: 7.8 mg/dL — AB (ref 8.9–10.3)
CHLORIDE: 108 mmol/L (ref 101–111)
CO2: 20 mmol/L — AB (ref 22–32)
CREATININE: 0.82 mg/dL (ref 0.61–1.24)
GFR calc Af Amer: >60 mL/min (ref >60)
GFR calc non Af Amer: >60 mL/min (ref >60)
Glucose, Bld: 110 mg/dL — ABNORMAL HIGH (ref 65–99)
Phosphorus: 2 mg/dL — ABNORMAL LOW (ref 2.5–4.6)
Potassium: 4 mmol/L (ref 3.5–5.1)
SODIUM: 136 mmol/L (ref 135–145)

## 2016-08-23 LAB — MAGNESIUM: Magnesium: 2.1 mg/dL (ref 1.7–2.4)

## 2016-08-23 MED ORDER — HYDROCOD POLST-CPM POLST ER 10-8 MG/5ML PO SUER
5.0000 mL | Freq: Two times a day (BID) | ORAL | Status: DC
  Administered 2016-08-23 – 2016-08-24 (×3): 5 mL via ORAL
  Filled 2016-08-23 (×3): qty 5

## 2016-08-23 MED ORDER — ACETAMINOPHEN 325 MG PO TABS
650.0000 mg | ORAL_TABLET | Freq: Four times a day (QID) | ORAL | Status: DC | PRN
  Administered 2016-08-23: 650 mg via ORAL
  Filled 2016-08-23: qty 2

## 2016-08-23 MED ORDER — K PHOS MONO-SOD PHOS DI & MONO 155-852-130 MG PO TABS
500.0000 mg | ORAL_TABLET | Freq: Three times a day (TID) | ORAL | Status: DC
  Administered 2016-08-23 – 2016-08-24 (×3): 500 mg via ORAL
  Filled 2016-08-23 (×6): qty 2

## 2016-08-23 NOTE — Progress Notes (Signed)
Rt made pt nebs PRN only. Pt has refused nebs for two days. Pt has PRN nebs if needed. No distress noted at this time and pt on room air.

## 2016-08-23 NOTE — Progress Notes (Signed)
Patient c/o headace, doesn't want to take percocet, AHal Hope paged. Given verbal order for 650 mg Tylenol Q6 hour PRN.

## 2016-08-23 NOTE — Progress Notes (Signed)
PROGRESS NOTE  Nathaniel Miles  XTK:[id number] DOB: 01/27/1951 DOA: 08/21/2016 PCP: Carmie Kanner, NP Outpatient Specialists:  Subjective: On this morning, reported severe headache and shortness of breath. Continues to complain about cough with minimal sputum production.  Brief Narrative:  Nathaniel Miles is a 66 y.o. 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 He denies sick contacts or travel history. Symptoms are very similar to the time that he had a community-acquired pneumonia about a year or so ago.  Assessment & Plan:   Principal Problem:   CAP (community acquired pneumonia) Active Problems:   Anemia   Pleuritic chest pain   Respiratory insufficiency   COPD exacerbation ([place])   Sepsis due to pneumonia ([place])   Hypophosphatemia   Abnormal chest x-ray    Sepsis Present on admission, WBCs of 33, respiratory rate of 33 and presence of pneumonia on the CXR. Lactic acid slightly elevated at 1.92, this is improved after IV fluid hydration. Sepsis physiology appears to be improving, tachypnea improving as well as leukocytosis.  CAP (community acquired pneumonia) Right lower lobe pneumonia per CXR. Started on azithromycin and Rocephin, patient doing better, still complaining about severe cough and SOB. Continue supportive management with bronchodilators, mucolytics, antitussives and oxygen as needed.  Abnormal chest x-ray Initial x-ray showed questionable mass, given his history of smoking CT was obtained. CT scan showed no suspicion for lung mass, likely x-ray findings related to pneumonia.  Acute respiratory failure with hypoxia Oxygen  saturation went down, this is likely secondary to pneumonia, patient required nonrebreather mask. BiPAP ordered but patient did not needed, patient improved quickly, he is currently on room air, sats in the mid 90s.  Acute COPD exacerbation ([place]) Continue scheduled and as needed bronchodilators. Continue Solu-Medrol and IV antibiotics Magnesium sulfate 2 g IVPB 1 dose. Counseled about smoking cessation at bedside.  Anemia Anemia panel showed iron deficiency, patient denies any dark stools or bleeding. Place on iron supplements on discharge.  Pleuritic chest pain Analgesics as needed. Continue treatment for pneumonia.  Hypophosphatemia Will replace orally, check phosphorus in the morning  Hyperglycemia Monitor blood glucose.   DVT prophylaxis:  Code Status: Full Code Family Communication:  Disposition Plan:  Diet: Diet Heart Room service appropriate? Yes; Fluid consistency: Thin  Consultants:   None  Procedures:   None  Antimicrobials:   None   Objective: Vitals:   08/22/16 1940 08/22/16 2057 08/23/16 0603 08/23/16 0751  BP:  (!) 95/53 113/72   Pulse:  63 60   Resp:  18 18   Temp:  98.2 F (36.8 C) 97.6 F (36.4 C)   TempSrc:  Oral Oral   SpO2: 96% 95% 96% 96%  Weight:      Height:        Intake/Output Summary (Last 24 hours) at 08/23/16 1048 Last data filed at 08/23/16 0600  Gross per 24 hour  Intake          2163.33 ml  Output                0 ml  Net          2163.33 ml   Filed Weights   08/22/16 1300  Weight: 81.2 kg (179  lb 0.2 oz)    Examination: General exam: Appears calm and comfortable  Respiratory system: Clear to auscultation. Respiratory effort normal. Cardiovascular system: S1 & S2 heard, RRR. No JVD, murmurs, rubs, gallops or clicks. No pedal edema. Gastrointestinal system: Abdomen is nondistended, soft and nontender. No organomegaly or masses felt. Normal bowel sounds heard. Central nervous system: Alert and oriented. No  focal neurological deficits. Extremities: Symmetric 5 x 5 power. Skin: No rashes, lesions or ulcers Psychiatry: Judgement and insight appear normal. Mood & affect appropriate.   Data Reviewed: I have personally reviewed following labs and imaging studies  CBC:  Recent Labs Lab 08/21/16 2036 08/22/16 0600 08/23/16 0553  WBC 31.9* 33.0* 26.3*  NEUTROABS 29.0* 32.0* 22.7*  HGB 11.7* 12.0* 10.6*  HCT 35.3* 37.7* 32.3*  MCV 88.7 90.6 90.2  PLT 229 198 176   Basic Metabolic Panel:  Recent Labs Lab 08/21/16 2036 08/23/16 0553  NA 134* 136  K 3.7 4.0  CL 103 108  CO2 22 20*  GLUCOSE 143* 110*  BUN 17 20  CREATININE 1.06 0.82  CALCIUM 7.8* 7.8*  MG  --  2.1  PHOS 2.0* 2.0*   GFR: Estimated Creatinine Clearance: 101.2 mL/min (by C-G formula based on SCr of 0.82 mg/dL). Liver Function Tests:  Recent Labs Lab 08/21/16 2036 08/23/16 0553  AST 40  --   ALT 21  --   ALKPHOS 43  --   BILITOT 1.8*  --   PROT 6.5  --   ALBUMIN 3.1* 2.6*   No results for input(s): LIPASE, AMYLASE in the last 168 hours. No results for input(s): AMMONIA in the last 168 hours. Coagulation Profile: No results for input(s): INR, PROTIME in the last 168 hours. Cardiac Enzymes: No results for input(s): CKTOTAL, CKMB, CKMBINDEX, TROPONINI in the last 168 hours. BNP (last 3 results) No results for input(s): PROBNP in the last 8760 hours. HbA1C: No results for input(s): HGBA1C in the last 72 hours. CBG: No results for input(s): GLUCAP in the last 168 hours. Lipid Profile: No results for input(s): CHOL, HDL, LDLCALC, TRIG, CHOLHDL, LDLDIRECT in the last 72 hours. Thyroid Function Tests: No results for input(s): TSH, T4TOTAL, FREET4, T3FREE, THYROIDAB in the last 72 hours. Anemia Panel:  Recent Labs  08/22/16 0600  VITAMINB12 288  FOLATE 14.2  FERRITIN 195  TIBC 252  IRON 7*  RETICCTPCT 1.2   Urine analysis:    Component Value Date/Time   COLORURINE YELLOW 08/21/2016 2255    APPEARANCEUR CLEAR 08/21/2016 2255   LABSPEC 1.024 08/21/2016 2255   PHURINE 6.0 08/21/2016 2255   GLUCOSEU NEGATIVE 08/21/2016 2255   HGBUR NEGATIVE 08/21/2016 2255   BILIRUBINUR NEGATIVE 08/21/2016 2255   KETONESUR NEGATIVE 08/21/2016 2255   PROTEINUR 30 (A) 08/21/2016 2255   NITRITE NEGATIVE 08/21/2016 2255   LEUKOCYTESUR NEGATIVE 08/21/2016 2255   Sepsis Labs: @LABRCNTIP (procalcitonin:4,lacticidven:4)  )No results found for this or any previous visit (from the past 240 hour(s)).   Invalid input(s): PROCALCITONIN, LACTICACIDVEN   Radiology Studies: Dg Chest 2 View  Result Date: 08/21/2016 CLINICAL DATA:  66 year old male with cough shortness of breath and fever. Smoker. EXAM: CHEST  2 VIEW COMPARISON:  Chest radiographs 06/25/2015 FINDINGS: 6-8 cm masslike area of opacity posterior to the right hilum in the superior segment of the right lower lobe. Visible hilar contour appears stable. Other mediastinal contours are stable. No pleural effusion. No pneumothorax or pulmonary edema. Stable left lung. No bony destruction or acute osseous abnormality identified. Negative visible bowel  gas pattern. IMPRESSION: Mass like 6-8 cm opacity in the superior segment of the right lower lobe posteriorly. Main differential considerations are Bronchogenic Carcinoma and Pneumonia. No pleural effusion identified. If pneumonia is suspected clinically then follow-up with PA and lateral chest X-ray in 3-4 weeks following antibiotic therapy. Otherwise Chest CT (IV contrast preferred) is recommended. Electronically Signed   By: Genevie Ann M.D.   On: 08/21/2016 21:06   Ct Chest W Contrast  Result Date: 08/22/2016 CLINICAL DATA:  Acute onset of generalized weakness and generalized chest pain. Shortness of breath. Decreased O2 saturation. EXAM: CT CHEST WITH CONTRAST TECHNIQUE: Multidetector CT imaging of the chest was performed during intravenous contrast administration. CONTRAST:  74mL ISOVUE-300 IOPAMIDOL  (ISOVUE-300) INJECTION 61% COMPARISON:  Chest radiograph performed 08/21/2016 FINDINGS: Cardiovascular: The heart is normal in size. Scattered coronary artery calcifications seen. Scattered calcification is noted along the thoracic aorta and proximal great vessels. Mediastinum/Nodes: Scattered mediastinal nodes remain borderline normal in size. No pericardial effusion is identified. The thyroid gland is unremarkable. No axillary lymphadenopathy is seen. Lungs/Pleura: Dense right lower lobe airspace opacification most likely reflects pneumonia. Mild left basilar airspace opacity is also seen. No pleural effusion or pneumothorax is identified. Mild scarring is noted at the right lung apex. No masses are identified. Upper Abdomen: The visualized portions of the liver and spleen are unremarkable. Nonspecific perinephric stranding is noted bilaterally. The visualized portions of the adrenal glands are unremarkable. Musculoskeletal: No acute osseous abnormalities are identified. The visualized musculature is unremarkable in appearance. IMPRESSION: 1. Dense right lower lobe airspace opacity most likely reflects pneumonia. Mild left basilar airspace opacity also seen. Followup PA and lateral chest X-ray is recommended in 3-4 weeks following trial of antibiotic therapy to ensure resolution and exclude underlying malignancy. 2. Scattered coronary artery calcifications seen. 3. Mild scarring at the right lung apex. Electronically Signed   By: Garald Balding M.D.   On: 08/22/2016 03:07        Scheduled Meds: . azithromycin  500 mg Intravenous Q24H  . Cariprazine HCl  1.5 mg Oral QHS  . cefTRIAXone (ROCEPHIN)  IV  1 g Intravenous Q24H  . guaiFENesin  1,200 mg Oral BID  . heparin  5,000 Units Subcutaneous Q8H  . lamoTRIgine  25 mg Oral BID  . venlafaxine XR  75 mg Oral Q breakfast   Continuous Infusions: . sodium chloride 100 mL/hr at 08/22/16 2238     LOS: 2 days    Time spent: 35  minutes    Fadumo Heng A, MD [facility] Pager [phone]  If 7PM-7AM, please contact night-coverage [email or web address] Password [facility] 08/23/2016, 10:48 AM

## 2016-08-24 DIAGNOSIS — R0689 Other abnormalities of breathing: Secondary | ICD-10-CM

## 2016-08-24 DIAGNOSIS — J189 Pneumonia, unspecified organism: Secondary | ICD-10-CM

## 2016-08-24 LAB — RENAL FUNCTION PANEL
Albumin: 2.8 g/dL — ABNORMAL LOW (ref 3.5–5.0)
Anion gap: 7 (ref 5–15)
BUN: 17 mg/dL (ref 6–20)
CHLORIDE: 108 mmol/L (ref 101–111)
CO2: 22 mmol/L (ref 22–32)
Calcium: 7.8 mg/dL — ABNORMAL LOW (ref 8.9–10.3)
Creatinine, Ser: 0.84 mg/dL (ref 0.61–1.24)
GFR calc Af Amer: >60 mL/min (ref >60)
Glucose, Bld: 87 mg/dL (ref 65–99)
POTASSIUM: 4.1 mmol/L (ref 3.5–5.1)
Phosphorus: 2.7 mg/dL (ref 2.5–4.6)
Sodium: 137 mmol/L (ref 135–145)

## 2016-08-24 LAB — CBC WITH DIFFERENTIAL/PLATELET
BASOS ABS: 0 10*3/uL (ref 0.0–0.1)
Basophils Relative: 0 %
Eosinophils Absolute: 0.1 10*3/uL (ref 0.0–0.7)
Eosinophils Relative: 1 %
HEMATOCRIT: 31.9 % — AB (ref 39.0–52.0)
Hemoglobin: 10.2 g/dL — ABNORMAL LOW (ref 13.0–17.0)
LYMPHS ABS: 3.3 10*3/uL (ref 0.7–4.0)
LYMPHS PCT: 27 %
MCH: 28.1 pg (ref 26.0–34.0)
MCHC: 32 g/dL (ref 30.0–36.0)
MCV: 87.9 fL (ref 78.0–100.0)
MONO ABS: 0.8 10*3/uL (ref 0.1–1.0)
MONOS PCT: 6 %
NEUTROS ABS: 8.3 10*3/uL — AB (ref 1.7–7.7)
Neutrophils Relative %: 66 %
Platelets: 204 10*3/uL (ref 150–400)
RBC: 3.63 MIL/uL — ABNORMAL LOW (ref 4.22–5.81)
RDW: 13.6 % (ref 11.5–15.5)
WBC: 12.5 10*3/uL — ABNORMAL HIGH (ref 4.0–10.5)

## 2016-08-24 MED ORDER — PREDNISONE 10 MG (21) PO TBPK: ORAL_TABLET | ORAL | 0 refills | Status: DC

## 2016-08-24 MED ORDER — AZITHROMYCIN 250 MG PO TABS: ORAL_TABLET | ORAL | 0 refills | Status: DC

## 2016-08-24 MED ORDER — GUAIFENESIN ER 600 MG PO TB12: 1200.0000 mg | ORAL_TABLET | Freq: Two times a day (BID) | ORAL | 0 refills | Status: DC

## 2016-08-24 MED ORDER — CEFUROXIME AXETIL 500 MG PO TABS: 500.0000 mg | ORAL_TABLET | Freq: Two times a day (BID) | ORAL | 0 refills | Status: DC

## 2016-08-24 NOTE — Discharge Summary (Signed)
Physician Discharge Summary  Nathaniel Miles CVE:[id number] DOB: Sep 16, 1950 DOA: 08/21/2016  PCP: Carmie Kanner, NP  Admit date: 08/21/2016 Discharge date: 08/24/2016  Admitted From: Home Disposition: Home  Recommendations for Outpatient Follow-up:  1. Follow up with PCP in 1-2 weeks 2. Please obtain BMP/CBC in one week. 3. Please obtain PA/lateral CXR in 4 weeks follow-up right lower lobe findings.  Home Health: NA Equipment/Devices:NA  Discharge Condition: Stable CODE STATUS: Full Code Diet recommendation: Diet Heart Room service appropriate? Yes; Fluid consistency: Thin Diet - low sodium heart healthy  Brief/Interim Summary: Nathaniel Miles a 66 y.o.malewith medical history significant of degenerative disc disease of cervical and lumbar spine, meningioma, squamous cell CA of oral mucosa, tobacco use disorder who is coming to the emergency department with complaints of progressively worse weakness, associated with nonproductive cough, fever, pleuritic chest pain of lower right sided chest wall and dyspnea for the past 2 days. He denies sick contacts or travel history. Symptoms are very similar to the time that he had a community-acquired pneumonia about a year or so ago.  Discharge Diagnoses:  Principal Problem:   CAP (community acquired pneumonia) Active Problems:   Anemia   Pleuritic chest pain   Respiratory insufficiency   COPD exacerbation (HCC)   Sepsis due to pneumonia ([place])   Hypophosphatemia   Abnormal chest x-ray   Sepsis Present on admission, WBCs of 33, respiratory rate of 33 and presence of pneumonia on the CXR. Lactic acid slightly elevated at 1.92, this is improved after IV fluid hydration. Sepsis physiology Resolved  CAP (community acquired pneumonia) Right lower lobe pneumonia per CXR. Started on azithromycin and Rocephin, patient doing better, still complaining about severe cough and SOB. Treated with supportive management with bronchodilators,  mucolytics, antitussives and oxygen as needed. Discharged on Ceftin and azithromycin for 5 more days.  Abnormal chest x-ray Initial x-ray showed questionable mass, given his history of smoking CT was obtained. CT showed no suspicion for lung mass, the CXR findings likely are related to pneumonia, recommend repeat PA/lateral CXR in 4 weeks.  Acute respiratory failure with hypoxia Oxygen saturation went down, this is likely secondary to pneumonia, patient required nonrebreather mask. BiPAP ordered but patient did not needed, this is resolved quickly, he is currently on room air, sats in the mid 90s.  Acute COPD exacerbation ([place]) Continue scheduled and as needed bronchodilators. Continue Solu-Medrol and IV antibiotics Counseled about smoking cessation. Discharge antibiotics, Mucinex and steroid taper.  Anemia Anemia panel showed iron deficiency, patient denies any dark stools or bleeding. Place on iron supplements on discharge.  Pleuritic chest pain Analgesics as needed. Continue treatment for pneumonia.  Hypophosphatemia Replaced orally  Hyperglycemia Steroid induced hyperglycemia   Discharge Instructions  Discharge Instructions    Diet - low sodium heart healthy    Complete by:  As directed    Increase activity slowly    Complete by:  As directed      Allergies as of 08/24/2016      Reactions   Penicillins Anaphylaxis   Has patient had a PCN reaction causing immediate rash, facial/tongue/throat swelling, SOB or lightheadedness with hypotension: Yes Has patient had a PCN reaction causing severe rash involving mucus membranes or skin necrosis: No Has patient had a PCN reaction that required hospitalization Yes Has patient had a PCN reaction occurring within the last 10 years: No If all of the above answers are "NO", then may proceed with Cephalosporin use. *Per EDP, patient has tolerated Keflex in the past*  Stadol [butorphanol] Anaphylaxis      Medication  List    STOP taking these medications   magic mouthwash w/lidocaine Soln   nystatin 100000 UNIT/ML suspension Commonly known as:  MYCOSTATIN     TAKE these medications   albuterol 108 (90 Base) MCG/ACT inhaler Commonly known as:  PROVENTIL HFA;VENTOLIN HFA Inhale 2 puffs into the lungs every 6 (six) hours as needed for wheezing or shortness of breath.   azithromycin 250 MG tablet Commonly known as:  ZITHROMAX Z-PAK Take 2 tabs on day, then 1 tab PO daily   Cariprazine HCl 1.5 MG Caps Take 1.5 mg by mouth at bedtime.   cefUROXime 500 MG tablet Commonly known as:  CEFTIN Take 1 tablet (500 mg total) by mouth 2 (two) times daily with a meal.   guaiFENesin 600 MG 12 hr tablet Commonly known as:  MUCINEX Take 2 tablets (1,200 mg total) by mouth 2 (two) times daily.   lamoTRIgine 25 MG tablet Commonly known as:  LAMICTAL Take 25 mg by mouth 2 (two) times daily.   oxyCODONE-acetaminophen 5-325 MG tablet Commonly known as:  PERCOCET/ROXICET Take 1 tablet by mouth every 8 (eight) hours as needed for severe pain. Please do not exceed 4000 mg of acetaminophen (Tylenol) a 24-hour period. Please note that he may be prescribed additional medicine that contains acetaminophen.   predniSONE 10 MG (21) Tbpk tablet Commonly known as:  STERAPRED UNI-PAK 21 TAB Take 6-5-4-3-2-1 PO daily till gone   venlafaxine XR 37.5 MG 24 hr capsule Commonly known as:  EFFEXOR-XR Take 37.5 mg by mouth daily with breakfast.       Allergies  Allergen Reactions  . Penicillins Anaphylaxis    Has patient had a PCN reaction causing immediate rash, facial/tongue/throat swelling, SOB or lightheadedness with hypotension: Yes Has patient had a PCN reaction causing severe rash involving mucus membranes or skin necrosis: No Has patient had a PCN reaction that required hospitalization Yes Has patient had a PCN reaction occurring within the last 10 years: No If all of the above answers are "NO", then may  proceed with Cephalosporin use. *Per EDP, patient has tolerated Keflex in the past*   . Stadol [Butorphanol] Anaphylaxis    Consultations:  None  Procedures (Echo, Carotid, EGD, Colonoscopy, ERCP)   Radiological studies: Dg Chest 2 View  Result Date: 08/21/2016 CLINICAL DATA:  66 year old male with cough shortness of breath and fever. Smoker. EXAM: CHEST  2 VIEW COMPARISON:  Chest radiographs 06/25/2015 FINDINGS: 6-8 cm masslike area of opacity posterior to the right hilum in the superior segment of the right lower lobe. Visible hilar contour appears stable. Other mediastinal contours are stable. No pleural effusion. No pneumothorax or pulmonary edema. Stable left lung. No bony destruction or acute osseous abnormality identified. Negative visible bowel gas pattern. IMPRESSION: Mass like 6-8 cm opacity in the superior segment of the right lower lobe posteriorly. Main differential considerations are Bronchogenic Carcinoma and Pneumonia. No pleural effusion identified. If pneumonia is suspected clinically then follow-up with PA and lateral chest X-ray in 3-4 weeks following antibiotic therapy. Otherwise Chest CT (IV contrast preferred) is recommended. Electronically Signed   By: Genevie Ann M.D.   On: 08/21/2016 21:06   Ct Chest W Contrast  Result Date: 08/22/2016 CLINICAL DATA:  Acute onset of generalized weakness and generalized chest pain. Shortness of breath. Decreased O2 saturation. EXAM: CT CHEST WITH CONTRAST TECHNIQUE: Multidetector CT imaging of the chest was performed during intravenous contrast administration. CONTRAST:  34mL ISOVUE-300  IOPAMIDOL (ISOVUE-300) INJECTION 61% COMPARISON:  Chest radiograph performed 08/21/2016 FINDINGS: Cardiovascular: The heart is normal in size. Scattered coronary artery calcifications seen. Scattered calcification is noted along the thoracic aorta and proximal great vessels. Mediastinum/Nodes: Scattered mediastinal nodes remain borderline normal in size. No  pericardial effusion is identified. The thyroid gland is unremarkable. No axillary lymphadenopathy is seen. Lungs/Pleura: Dense right lower lobe airspace opacification most likely reflects pneumonia. Mild left basilar airspace opacity is also seen. No pleural effusion or pneumothorax is identified. Mild scarring is noted at the right lung apex. No masses are identified. Upper Abdomen: The visualized portions of the liver and spleen are unremarkable. Nonspecific perinephric stranding is noted bilaterally. The visualized portions of the adrenal glands are unremarkable. Musculoskeletal: No acute osseous abnormalities are identified. The visualized musculature is unremarkable in appearance. IMPRESSION: 1. Dense right lower lobe airspace opacity most likely reflects pneumonia. Mild left basilar airspace opacity also seen. Followup PA and lateral chest X-ray is recommended in 3-4 weeks following trial of antibiotic therapy to ensure resolution and exclude underlying malignancy. 2. Scattered coronary artery calcifications seen. 3. Mild scarring at the right lung apex. Electronically Signed   By: Garald Balding M.D.   On: 08/22/2016 03:07   Dg Hip Unilat W Or Wo Pelvis 2-3 Views Right  Result Date: 08/12/2016 CLINICAL DATA:  Intermittent right-sided hip pain for the past 5 years. No known recent injury. EXAM: DG HIP (WITH OR WITHOUT PELVIS) 2-3V RIGHT COMPARISON:  None in PACs FINDINGS: The bones are subjectively adequately mineralized. The pelvis exhibits no lytic or blastic lesion. The observed portions of the sacrum are normal. There is asymmetric narrowing of the superior aspect of the right hip joint space. The articular surfaces of the femoral head and acetabulum remains smoothly rounded. The femoral neck, intertrochanteric, and subtrochanteric regions are normal. IMPRESSION: Asymmetric joint space loss of the right hip consistent with osteoarthritis. No acute bony abnormality. Electronically Signed   By: David   Martinique M.D.   On: 08/12/2016 14:49    Subjective:  Discharge Exam: Vitals:   08/23/16 1500 08/23/16 1936 08/23/16 2019 08/24/16 0535  BP: 106/67 118/73  115/86  Pulse: 63 67  66  Resp: 20 20  20   Temp: 98.1 F (36.7 C) 98.1 F (36.7 C)  97.8 F (36.6 C)  TempSrc: Oral Oral  Oral  SpO2: 98% 98% 94% 98%  Weight:      Height:       General: Pt is alert, awake, not in acute distress Cardiovascular: RRR, S1/S2 +, no rubs, no gallops Respiratory: CTA bilaterally, no wheezing, no rhonchi Abdominal: Soft, NT, ND, bowel sounds + Extremities: no edema, no cyanosis   The results of significant diagnostics from this hospitalization (including imaging, microbiology, ancillary and laboratory) are listed below for reference.    Microbiology: Recent Results (from the past 240 hour(s))  Blood culture (routine x 2)     Status: None (Preliminary result)   Collection Time: 08/21/16  9:00 PM  Result Value Ref Range Status   Specimen Description BLOOD RIGHT HAND  Final   Special Requests BOTTLES DRAWN AEROBIC AND ANAEROBIC BCAV  Final   Culture   Final    NO GROWTH 1 DAY Performed at [facility], [address], [place], [place] [address]    Report Status PENDING  Incomplete  Blood culture (routine x 2)     Status: None (Preliminary result)   Collection Time: 08/21/16  9:20 PM  Result Value Ref Range  Status   Specimen Description BLOOD LEFT ARM  Final   Special Requests   Final    BOTTLES DRAWN AEROBIC AND ANAEROBIC Blood Culture adequate volume   Culture   Final    NO GROWTH 1 DAY Performed at [facility], [address] [place], [place] [address]    Report Status PENDING  Incomplete     Labs: BNP (last 3 results) No results for input(s): BNP in the last 8760 hours. Basic Metabolic Panel:  Recent Labs Lab 08/21/16 2036 08/23/16 0553 08/24/16 0529  NA 134* 136 137  K 3.7 4.0 4.1  CL 103 108 108  CO2 22 20* 22  GLUCOSE 143* 110* 87  BUN 17 20 17    CREATININE 1.06 0.82 0.84  CALCIUM 7.8* 7.8* 7.8*  MG  --  2.1  --   PHOS 2.0* 2.0* 2.7   Liver Function Tests:  Recent Labs Lab 08/21/16 2036 08/23/16 0553 08/24/16 0529  AST 40  --   --   ALT 21  --   --   ALKPHOS 43  --   --   BILITOT 1.8*  --   --   PROT 6.5  --   --   ALBUMIN 3.1* 2.6* 2.8*   No results for input(s): LIPASE, AMYLASE in the last 168 hours. No results for input(s): AMMONIA in the last 168 hours. CBC:  Recent Labs Lab 08/21/16 2036 08/22/16 0600 08/23/16 0553 08/24/16 0529  WBC 31.9* 33.0* 26.3* 12.5*  NEUTROABS 29.0* 32.0* 22.7* 8.3*  HGB 11.7* 12.0* 10.6* 10.2*  HCT 35.3* 37.7* 32.3* 31.9*  MCV 88.7 90.6 90.2 87.9  PLT 229 198 174 204   Cardiac Enzymes: No results for input(s): CKTOTAL, CKMB, CKMBINDEX, TROPONINI in the last 168 hours. BNP: Invalid input(s): POCBNP CBG: No results for input(s): GLUCAP in the last 168 hours. D-Dimer No results for input(s): DDIMER in the last 72 hours. Hgb A1c No results for input(s): HGBA1C in the last 72 hours. Lipid Profile No results for input(s): CHOL, HDL, LDLCALC, TRIG, CHOLHDL, LDLDIRECT in the last 72 hours. Thyroid function studies No results for input(s): TSH, T4TOTAL, T3FREE, THYROIDAB in the last 72 hours.  Invalid input(s): FREET3 Anemia work up  Recent Labs  08/22/16 0600  VITAMINB12 288  FOLATE 14.2  FERRITIN 195  TIBC 252  IRON 7*  RETICCTPCT 1.2   Urinalysis    Component Value Date/Time   COLORURINE YELLOW 08/21/2016 2255   APPEARANCEUR CLEAR 08/21/2016 2255   LABSPEC 1.024 08/21/2016 2255   PHURINE 6.0 08/21/2016 2255   GLUCOSEU NEGATIVE 08/21/2016 2255   HGBUR NEGATIVE 08/21/2016 2255   BILIRUBINUR NEGATIVE 08/21/2016 2255   KETONESUR NEGATIVE 08/21/2016 2255   PROTEINUR 30 (A) 08/21/2016 2255   NITRITE NEGATIVE 08/21/2016 2255   LEUKOCYTESUR NEGATIVE 08/21/2016 2255   Sepsis Labs Invalid input(s): PROCALCITONIN,  WBC,  LACTICIDVEN Microbiology Recent Results  (from the past 240 hour(s))  Blood culture (routine x 2)     Status: None (Preliminary result)   Collection Time: 08/21/16  9:00 PM  Result Value Ref Range Status   Specimen Description BLOOD RIGHT HAND  Final   Special Requests BOTTLES DRAWN AEROBIC AND ANAEROBIC BCAV  Final   Culture   Final    NO GROWTH 1 DAY Performed at [facility], [place] [address] [place], [place] [address]    Report Status PENDING  Incomplete  Blood culture (routine x 2)     Status: None (Preliminary result)  Collection Time: 08/21/16  9:20 PM  Result Value Ref Range Status   Specimen Description BLOOD LEFT ARM  Final   Special Requests   Final    BOTTLES DRAWN AEROBIC AND ANAEROBIC Blood Culture adequate volume   Culture   Final    NO GROWTH 1 DAY Performed at [facility], [employer] [address], [place], [place] [address]    Report Status PENDING  Incomplete     Time coordinating discharge: Over 30 minutes  SIGNED:   Birdie Hopes, MD  [facility] 08/24/2016, 10:48 AM Pager   If 7PM-7AM, please contact night-coverage [email or web address] Password [email or web address]

## 2016-08-24 NOTE — Care Management Note (Signed)
Case Management Note  Patient Details  Name: Nathaniel Miles MRN: [id number] Date of Birth: 1950-10-09  Subjective/Objective:65 y/o m admitted w/CAP. From home.                    Action/Plan:d/c home.   Expected Discharge Date:  08/24/16               Expected Discharge Plan:  Home/Self Care  In-House Referral:     Discharge planning Services  CM Consult  Post Acute Care Choice:    Choice offered to:     DME Arranged:    DME Agency:     HH Arranged:    HH Agency:     Status of Service:  Completed, signed off  If discussed at [employer] of Stay Meetings, dates discussed:    Additional Comments:  Dessa Phi, RN 08/24/2016, 11:19 AM

## 2016-08-25 ENCOUNTER — Emergency Department (HOSPITAL_COMMUNITY)
Admission: EM | Admit: 2016-08-25 | Discharge: 2016-08-26 | Disposition: A | Discharge status: Home / Self Care | Payer: Medicaid Other | Attending: Emergency Medicine | Admitting: Emergency Medicine

## 2016-08-25 ENCOUNTER — Emergency Department (HOSPITAL_COMMUNITY): Payer: Medicaid Other

## 2016-08-25 DIAGNOSIS — Z85818 Personal history of malignant neoplasm of other sites of lip, oral cavity, and pharynx: Secondary | ICD-10-CM | POA: Insufficient documentation

## 2016-08-25 DIAGNOSIS — R0602 Shortness of breath: Secondary | ICD-10-CM

## 2016-08-25 DIAGNOSIS — J449 Chronic obstructive pulmonary disease, unspecified: Secondary | ICD-10-CM | POA: Diagnosis not present

## 2016-08-25 DIAGNOSIS — R55 Syncope and collapse: Secondary | ICD-10-CM | POA: Diagnosis not present

## 2016-08-25 DIAGNOSIS — Z79899 Other long term (current) drug therapy: Secondary | ICD-10-CM | POA: Insufficient documentation

## 2016-08-25 DIAGNOSIS — J189 Pneumonia, unspecified organism: Secondary | ICD-10-CM

## 2016-08-25 DIAGNOSIS — J181 Lobar pneumonia, unspecified organism: Secondary | ICD-10-CM | POA: Diagnosis not present

## 2016-08-25 DIAGNOSIS — R0789 Other chest pain: Secondary | ICD-10-CM

## 2016-08-25 DIAGNOSIS — F17200 Nicotine dependence, unspecified, uncomplicated: Secondary | ICD-10-CM | POA: Insufficient documentation

## 2016-08-25 DIAGNOSIS — R072 Precordial pain: Secondary | ICD-10-CM | POA: Diagnosis present

## 2016-08-25 LAB — CBC
HCT: 36.3 % — ABNORMAL LOW (ref 39.0–52.0)
Hemoglobin: 12 g/dL — ABNORMAL LOW (ref 13.0–17.0)
MCH: 29.3 pg (ref 26.0–34.0)
MCHC: 33.1 g/dL (ref 30.0–36.0)
MCV: 88.8 fL (ref 78.0–100.0)
PLATELETS: 273 10*3/uL (ref 150–400)
RBC: 4.09 MIL/uL — AB (ref 4.22–5.81)
RDW: 13.5 % (ref 11.5–15.5)
WBC: 13.3 10*3/uL — AB (ref 4.0–10.5)

## 2016-08-25 LAB — CBG MONITORING, ED: GLUCOSE-CAPILLARY: 109 mg/dL — AB (ref 65–99)

## 2016-08-25 LAB — I-STAT TROPONIN, ED: TROPONIN I, POC: 0.02 ng/mL (ref 0.00–0.08)

## 2016-08-25 LAB — BASIC METABOLIC PANEL
ANION GAP: 8 (ref 5–15)
BUN: 15 mg/dL (ref 6–20)
CALCIUM: 8.5 mg/dL — AB (ref 8.9–10.3)
CO2: 23 mmol/L (ref 22–32)
Chloride: 105 mmol/L (ref 101–111)
Creatinine, Ser: 1.02 mg/dL (ref 0.61–1.24)
Glucose, Bld: 104 mg/dL — ABNORMAL HIGH (ref 65–99)
POTASSIUM: 3.5 mmol/L (ref 3.5–5.1)
SODIUM: 136 mmol/L (ref 135–145)

## 2016-08-25 NOTE — ED Provider Notes (Signed)
[facility] DEPT Provider Note   CSN: [id number] Arrival date & time: 08/25/16  2051  By signing my name below, I, Jeanell Sparrow, attest that this documentation has been prepared under the direction and in the presence of non-physician practitioner, Melina Schools, PA-C. Electronically Signed: Jeanell Sparrow, Scribe. 08/25/2016. 11:36 PM.  History   Chief Complaint Chief Complaint  Patient presents with  . Shortness of Breath   The history is provided by the patient. No language interpreter was used.   HPI Comments: Nathaniel Miles is a 66 y.o. male with a PMHx of chronic bronchitis, Tobacco abuse and COPD who presents to the Emergency Department complaining of constant moderate substernal chest pain that started about 7 hours ago. He was discharged yesterday after being admitted to the hospital for 4 days for pneumonia and sepsis. Patient was discharged on azithromycin and cefuroxime. Patient has not picked up his antibiotics yet. He states he had a sudden onset of substernal chest pain, SOB, and then passed out at [facility] this afternoon. The patient reports that he was out for approximately 1-2 minutes. He hit his head on the floor. He describes the chest pain as "like a [place] kicked me". States that the chest pain lasted for a few seconds. Has not had chest pain since the incident 7 hours ago. Pain does not radiate. Not associated with diaphoresis, nausea, emesis. He reports associated generalized weakness and headache. He admits to a hx of smoking. Denies any fever, chills, vision changes lightheadedness, dizziness, abdominal pain, nausea, emesis, urinary symptoms, change in bowel habits, numbness/tingling, lower extremity edema, calf tenderness, history of DVT, cardiac history or other complaints at this time.    PCP: Carmie Kanner, NP  Past Medical History:  Diagnosis Date  . DDD (degenerative disc disease), lumbar   . Degenerative disc disease, cervical   . Meningioma ([place])   .  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 Pleuritic chest pain 08/22/2016  . Respiratory insufficiency 08/22/2016  . COPD exacerbation ([place]) 08/22/2016  . Sepsis due to pneumonia ([place]) 08/22/2016  . Hypophosphatemia 08/22/2016  . Abnormal chest x-ray 08/22/2016  . CAP (community acquired pneumonia) 08/21/2016  . Anemia 08/21/2016  . Chronic bronchitis ([place]) 06/26/2015  . Smoker 06/26/2015  . 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     Home Medications    Prior to Admission medications   Medication Sig Start Date End Date Taking? Authorizing Provider  albuterol (PROVENTIL HFA;VENTOLIN HFA) 108 (90 Base) MCG/ACT inhaler Inhale 2 puffs into the lungs every 6 (six) hours as needed for wheezing or shortness of breath. 07/03/15  Yes Olugbemiga E Doreene Burke, MD  azithromycin (ZITHROMAX Z-PAK) 250 MG tablet Take 2 tabs on day, then 1 tab PO daily Patient taking differently: Take 250-500 mg by mouth daily. Pt is to take two tablets by mouth on day 1 and one tablet daily for the next four days. 08/24/16  Yes Verlee Monte, MD  Cariprazine HCl 1.5 MG CAPS Take 1.5 mg by mouth at bedtime.   Yes Historical Provider, MD  guaiFENesin (MUCINEX) 600 MG 12 hr tablet Take 2 tablets (1,200 mg total) by mouth 2 (two) times daily. 08/24/16  Yes Verlee Monte, MD  lamoTRIgine (LAMICTAL) 25 MG tablet Take 25 mg by mouth 2 (two) times daily.   Yes Historical Provider, MD  oxyCODONE-acetaminophen (PERCOCET/ROXICET) 5-325 MG tablet Take 1  tablet by mouth every 8 (eight) hours as needed for severe pain. Please do not exceed 4000 mg of acetaminophen (Tylenol) a 24-hour period. Please note that he may be prescribed additional medicine that contains acetaminophen. 08/12/16 08/27/16 Yes Fatima Blank, MD  venlafaxine XR (EFFEXOR-XR) 37.5 MG 24 hr capsule Take 37.5 mg by mouth daily with breakfast.    Yes  Historical Provider, MD  cefUROXime (CEFTIN) 500 MG tablet Take 1 tablet (500 mg total) by mouth 2 (two) times daily with a meal. 08/24/16   Verlee Monte, MD  predniSONE (STERAPRED UNI-PAK 21 TAB) 10 MG (21) TBPK tablet Take 6-5-4-3-2-1 PO daily till gone Patient taking differently: Take 10-60 mg by mouth daily. Pt is to take as a taper:  Six tablets on day 1, five tablets on day 2, four tablets on day 3, three tablets on day 4, two tablets on day 5, one tablet on day 6, then STOP. 08/24/16   Verlee Monte, MD    Family History Family History  Problem Relation Age of Onset  . Hypertension Other     Social History Social History  Substance Use Topics  . Smoking status: Current Some Day Smoker  . Smokeless tobacco: Never Used  . Alcohol use No     Allergies   Penicillins and Stadol [butorphanol]   Review of Systems Review of Systems  Constitutional: Negative for chills, diaphoresis and fever.  HENT: Negative for congestion.   Eyes: Negative for photophobia and visual disturbance.  Respiratory: Positive for cough and shortness of breath. Negative for wheezing.   Cardiovascular: Positive for chest pain (Substernal). Negative for leg swelling.  Gastrointestinal: Negative for abdominal pain, diarrhea, nausea and vomiting.  Genitourinary: Negative for dysuria, flank pain, frequency, hematuria and urgency.  Skin: Negative.   Neurological: Positive for syncope, weakness (Generalized) and headaches. Negative for dizziness, light-headedness and numbness.  All other systems reviewed and are negative.    Physical Exam Updated Vital Signs BP 138/76 (BP Location: Left Arm)   Pulse 72   Temp 98.5 F (36.9 C) (Oral)   Resp 16   SpO2 98%   Physical Exam  Constitutional: He is oriented to person, place, and time. He appears well-developed and well-nourished. No distress.  Patient is nontoxic appearing. Able to ambulate in the hallway with normal gait. No signs of distress. Sleeping in  the room on exam  HENT:  Head: Normocephalic and atraumatic.  Mouth/Throat: Oropharynx is clear and moist.  No signs of hematoma, contusion to the back of the head. No bilateral hemotympanum. No septal hematoma.  Eyes: Conjunctivae and EOM are normal. Pupils are equal, round, and reactive to light. Right eye exhibits no discharge. Left eye exhibits no discharge. No scleral icterus.  Neck: Normal range of motion. Neck supple. No thyromegaly present.  No midline tenderness. No deformities or step-offs noted. Full range of motion.  Cardiovascular: Normal rate, regular rhythm, normal heart sounds and intact distal pulses.  Exam reveals no gallop and no friction rub.   No murmur heard. Pulmonary/Chest: Effort normal and breath sounds normal. No respiratory distress. He has no wheezes. He exhibits no tenderness.  Course sounds noted in the right lower lobe. All other lung fields clear.  Abdominal: Soft. Bowel sounds are normal. He exhibits no distension. There is no tenderness. There is no rebound and no guarding.  Musculoskeletal: Normal range of motion.  No lower extremity edema or calf tenderness.  Lymphadenopathy:    He has no cervical adenopathy.  Neurological: He  is alert and oriented to person, place, and time.  The patient is alert, attentive, and oriented x 3. Speech is clear. Cranial nerve II-VII grossly intact. Negative pronator drift. Sensation intact. Strength 5/5 in all extremities. Reflexes 2+ and symmetric at biceps, triceps, knees, and ankles. Rapid alternating movement and fine finger movements intact. Romberg is absent. Posture and gait normal.   Skin: Skin is warm and dry. Capillary refill takes less than 2 seconds.  Psychiatric: He has a normal mood and affect.  Nursing note and vitals reviewed.    ED Treatments / Results  DIAGNOSTIC STUDIES: Oxygen Saturation is 98% on RA, normal by my interpretation.    COORDINATION OF CARE: 11:40 PM- Pt advised of plan for treatment  and pt agrees.  Labs (all labs ordered are listed, but only abnormal results are displayed) Labs Reviewed  BASIC METABOLIC PANEL - Abnormal; Notable for the following:       Result Value   Glucose, Bld 104 (*)    Calcium 8.5 (*)    All other components within normal limits  CBC - Abnormal; Notable for the following:    WBC 13.3 (*)    RBC 4.09 (*)    Hemoglobin 12.0 (*)    HCT 36.3 (*)    All other components within normal limits  CBG MONITORING, ED - Abnormal; Notable for the following:    Glucose-Capillary 109 (*)    All other components within normal limits  I-STAT TROPOININ, ED  I-STAT TROPOININ, ED    EKG  EKG Interpretation  Date/Time:  Tuesday August 25 2016 21:04:44 EDT Ventricular Rate:  69 PR Interval:    QRS Duration: 96 QT Interval:  396 QTC Calculation: 425 R Axis:   -33 Text Interpretation:  Sinus rhythm Left axis deviation Borderline low voltage, extremity leads Anteroseptal infarct, old Baseline wander in lead(s) V2 Confirmed by Hazle Coca [phone]) on 08/26/2016 12:02:29 AM       Radiology Dg Chest 2 View  Result Date: 08/25/2016 CLINICAL DATA:  History of pneumonia, syncopal episode EXAM: CHEST  2 VIEW COMPARISON:  08/22/2016, 08/21/2016 FINDINGS: Decreasing right posterior lower lobe opacity. Coarse interstitial opacities at the bilateral lung bases. Stable cardiomediastinal silhouette with atherosclerosis. No pneumothorax. IMPRESSION: 1. Decreasing posterior right lower lobe opacity, likely resolving pneumonia; continued radiographic follow-up is recommended 2. There are no new abnormalities visualized. Electronically Signed   By: Donavan Foil M.D.   On: 08/25/2016 21:56   Ct Head Wo Contrast  Result Date: 08/26/2016 CLINICAL DATA:  Sudden onset substernal chest pain, shortness of breath, and syncope. [place] and struck head on the floor. Generalize weakness, headache, and visual disturbance. EXAM: CT HEAD WITHOUT CONTRAST CT CERVICAL SPINE WITHOUT CONTRAST  TECHNIQUE: Multidetector CT imaging of the head and cervical spine was performed following the standard protocol without intravenous contrast. Multiplanar CT image reconstructions of the cervical spine were also generated. COMPARISON:  None. FINDINGS: CT HEAD FINDINGS Brain: No evidence of acute infarction, hemorrhage, hydrocephalus, extra-axial collection or mass lesion/mass effect. Vascular: No hyperdense vessel or unexpected calcification. Skull: Normal. Negative for fracture or focal lesion. Sinuses/Orbits: Mucosal thickening in the paranasal sinuses. No acute air-fluid levels. Mastoid air cells are not opacified. Other: None. CT CERVICAL SPINE FINDINGS Alignment: Slight anterior subluxation of C2 on C3 and of C7 on T1. There associated degenerative changes in the posterior elements, likely accounting for this change. Ligamentous injury could also have this appearance and is not excluded. Normal alignment of the facet joints. C1-2 articulation  appears intact. Skull base and vertebrae: No vertebral compression deformities. No focal bone lesion or bone destruction. Skull base appears intact. Soft tissues and spinal canal: No prevertebral fluid or swelling. No visible canal hematoma. Disc levels: Degenerative changes throughout the cervical spine with narrowed interspaces and endplate hypertrophic changes, most prominent at C4-5, C5-6, and C6-7 levels. Degenerative changes throughout the facet joints. Upper chest: Mild emphysematous changes in the lung apices. Other: None. IMPRESSION: No acute intracranial abnormalities. Slight anterior subluxation of C2 on C3 and C7 on T1, likely degenerative. Degenerative changes throughout the cervical spine. No acute displaced fractures identified. Electronically Signed   By: Lucienne Capers M.D.   On: 08/26/2016 01:15   Ct Angio Chest Pe W/cm &/or Wo Cm  Result Date: 08/26/2016 CLINICAL DATA:  Acute onset of moderate substernal chest pain and shortness of breath. Syncope  and fall. Initial encounter. EXAM: CT ANGIOGRAPHY CHEST WITH CONTRAST TECHNIQUE: Multidetector CT imaging of the chest was performed using the standard protocol during bolus administration of intravenous contrast. Multiplanar CT image reconstructions and MIPs were obtained to evaluate the vascular anatomy. CONTRAST:  90 mL of Isovue 370 IV contrast COMPARISON:  CT of the chest performed 08/22/2016 FINDINGS: Cardiovascular:  There is no evidence of pulmonary embolus. The heart is mildly enlarged. There is prominence of the pulmonary outflow tract, of uncertain significance. Mild calcification is noted at the aortic arch and proximal great vessels. Mediastinum/Nodes: Visualized mediastinal nodes remain borderline normal in size. No pericardial effusion is identified. The thyroid gland is grossly unremarkable in appearance. No axillary lymphadenopathy is appreciated. Lungs/Pleura: A trace right pleural effusion is noted, with dense right lower lobe airspace opacification, compatible with pneumonia. Additional minimal central right-sided airspace opacities are seen. An underlying right lower lobe peribronchial node measures 7 mm in short axis. No pneumothorax is identified. No dominant mass is seen. Upper Abdomen: The visualized portions of the liver and spleen are grossly unremarkable. The visualized portions of the pancreas, adrenal glands and kidneys are unremarkable. Nonspecific perinephric stranding is noted bilaterally. Musculoskeletal: No acute osseous abnormalities are identified. There is chronic deformity of the left mid clavicle. The visualized musculature is unremarkable in appearance. Review of the MIP images confirms the above findings. IMPRESSION: 1. No evidence of pulmonary embolus. 2. Trace right pleural effusion, with dense right lower lobe airspace opacification, compatible with pneumonia. Additional minimal right-sided central airspace opacities seen. 3. Mild cardiomegaly. Prominence of the pulmonary  outflow tract. Would correlate for any evidence of pulmonary arterial hypertension. Electronically Signed   By: Garald Balding M.D.   On: 08/26/2016 01:17   Ct Cervical Spine Wo Contrast  Result Date: 08/26/2016 CLINICAL DATA:  Sudden onset substernal chest pain, shortness of breath, and syncope. [place] and struck head on the floor. Generalize weakness, headache, and visual disturbance. EXAM: CT HEAD WITHOUT CONTRAST CT CERVICAL SPINE WITHOUT CONTRAST TECHNIQUE: Multidetector CT imaging of the head and cervical spine was performed following the standard protocol without intravenous contrast. Multiplanar CT image reconstructions of the cervical spine were also generated. COMPARISON:  None. FINDINGS: CT HEAD FINDINGS Brain: No evidence of acute infarction, hemorrhage, hydrocephalus, extra-axial collection or mass lesion/mass effect. Vascular: No hyperdense vessel or unexpected calcification. Skull: Normal. Negative for fracture or focal lesion. Sinuses/Orbits: Mucosal thickening in the paranasal sinuses. No acute air-fluid levels. Mastoid air cells are not opacified. Other: None. CT CERVICAL SPINE FINDINGS Alignment: Slight anterior subluxation of C2 on C3 and of C7 on T1. There associated degenerative changes in the  posterior elements, likely accounting for this change. Ligamentous injury could also have this appearance and is not excluded. Normal alignment of the facet joints. C1-2 articulation appears intact. Skull base and vertebrae: No vertebral compression deformities. No focal bone lesion or bone destruction. Skull base appears intact. Soft tissues and spinal canal: No prevertebral fluid or swelling. No visible canal hematoma. Disc levels: Degenerative changes throughout the cervical spine with narrowed interspaces and endplate hypertrophic changes, most prominent at C4-5, C5-6, and C6-7 levels. Degenerative changes throughout the facet joints. Upper chest: Mild emphysematous changes in the lung apices.  Other: None. IMPRESSION: No acute intracranial abnormalities. Slight anterior subluxation of C2 on C3 and C7 on T1, likely degenerative. Degenerative changes throughout the cervical spine. No acute displaced fractures identified. Electronically Signed   By: Lucienne Capers M.D.   On: 08/26/2016 01:15    Procedures Procedures (including critical care time)  Medications Ordered in ED Medications  iopamidol (ISOVUE-370) 76 % injection (not administered)  iopamidol (ISOVUE-370) 76 % injection 100 mL (90 mLs Intravenous Contrast Given 08/26/16 0046)  ipratropium-albuterol (DUONEB) 0.5-2.5 (3) MG/3ML nebulizer solution 3 mL (3 mLs Nebulization Given 08/26/16 0210)  cefUROXime (CEFTIN) tablet 500 mg (500 mg Oral Given 08/26/16 0442)  azithromycin (ZITHROMAX) tablet 250 mg (250 mg Oral Given 08/26/16 0442)  ketorolac (TORADOL) 15 MG/ML injection 15 mg (15 mg Intravenous Given 08/26/16 0442)     Initial Impression / Assessment and Plan / ED Course  I have reviewed the triage vital signs and the nursing notes.  Pertinent labs & imaging results that were available during my care of the patient were reviewed by me and considered in my medical decision making (see chart for details).     Patient presents to the ED today with complaints of chest pain, shortness of breath, generalized weakness, syncope, headache. Patient was recently discharged from hospital after a four-day stay for pneumonia. Patient was discharged with azithromycin and cefuroxime along with steroids. The patient has not got his antibiotic prescriptions filled. While walking around [place] today patient had a sudden acute onset of chest pain or shortness of breath with subsequent syncopal episode falling hitting the back of his head. Given that patient was recently admitted to the hospital high suspicion for PE. CTA of chest was ordered. Chest x-ray shows improving right lower lobe pneumonia. CT of chest shows no PE. Does show persistent  right sided pneumonia with mild cardiomegaly. Patient with leukocytosis of 13,000 however this is baseline from patient's discharged yesterday of 12,000. Initially admitted with a white blood cell count in the 20,000. Patient is afebrile and no tachycardia noted. Blood pressure stable. Patient is not hypoxic. Heart pathway score 3. Negative delta troponin. EKG shows no ischemic changes. Clinical presentation is not consistent with ACS. Patient's generalized weakness likely due to his persistent pneumonia and recent hospital stay. Patient does report headache from fall. CT of head and neck shows no acute abnormalities. Tordol given for ha. No focal neuro deficits noted. Patient given dose of antibiotics in the ED. Given DuoNeb and will be sent home with albuterol inhaler. Patient was able to ambulate with saturations above 90% and normal respirations. No signs of respiratory distress. Patient sleeping on my reassessment. Feels much improved. Encourage patient to get plenty of rest at home. Encouraged to get antibiotics filled and follow-up with his primary care doctor this week for recheck. Pt is hemodynamically stable, in NAD, & able to ambulate in the ED. Pain has been managed & has no  complaints prior to dc. Pt is comfortable with above plan and is stable for discharge at this time. All questions were answered prior to disposition. Strict return precautions for f/u to the ED were discussed.  Patient counseled on smoking cessation. Pt dicussed with Dr. Ayesha Rumpf who is agreeable to the above plan.   Final Clinical Impressions(s) / ED Diagnoses   Final diagnoses:  Syncope, unspecified syncope type  Community acquired pneumonia of right lower lobe of lung (HCC)  SOB (shortness of breath)  Other chest pain    New Prescriptions New Prescriptions   ALBUTEROL (PROVENTIL HFA;VENTOLIN HFA) 108 (90 BASE) MCG/ACT INHALER    Inhale 1-2 puffs into the lungs every 6 (six) hours as needed for wheezing or shortness of  breath.   I personally performed the services described in this documentation, which was scribed in my presence. The recorded information has been reviewed and is accurate.      Doristine Devoid, PA-C 08/26/16 [place], MD 08/26/16 [id number]

## 2016-08-25 NOTE — ED Triage Notes (Signed)
Pt states that he was released yesterday after being admitted for 4 days for pneumonia. States he was in [place] today and had a syncopal episode. States he now feels weak and has chest pain. Alert and oriented.

## 2016-08-26 ENCOUNTER — Emergency Department (HOSPITAL_COMMUNITY): Payer: Medicaid Other

## 2016-08-26 LAB — I-STAT TROPONIN, ED: Troponin i, poc: 0 ng/mL (ref 0.00–0.08)

## 2016-08-26 MED ORDER — IPRATROPIUM-ALBUTEROL 0.5-2.5 (3) MG/3ML IN SOLN
3.0000 mL | Freq: Once | RESPIRATORY_TRACT | Status: AC
  Administered 2016-08-26: 3 mL via RESPIRATORY_TRACT
  Filled 2016-08-26: qty 3

## 2016-08-26 MED ORDER — CEFUROXIME AXETIL 500 MG PO TABS
500.0000 mg | ORAL_TABLET | Freq: Once | ORAL | Status: AC
  Administered 2016-08-26: 500 mg via ORAL
  Filled 2016-08-26: qty 1

## 2016-08-26 MED ORDER — IOPAMIDOL (ISOVUE-370) INJECTION 76%
100.0000 mL | Freq: Once | INTRAVENOUS | Status: AC | PRN
  Administered 2016-08-26: 90 mL via INTRAVENOUS

## 2016-08-26 MED ORDER — AZITHROMYCIN 250 MG PO TABS
250.0000 mg | ORAL_TABLET | Freq: Once | ORAL | Status: AC
  Administered 2016-08-26: 250 mg via ORAL
  Filled 2016-08-26: qty 1

## 2016-08-26 MED ORDER — ALBUTEROL SULFATE HFA 108 (90 BASE) MCG/ACT IN AERS
1.0000 | INHALATION_SPRAY | Freq: Four times a day (QID) | RESPIRATORY_TRACT | 0 refills | Status: AC | PRN
Start: 2016-08-26 — End: ?

## 2016-08-26 MED ORDER — IOPAMIDOL (ISOVUE-370) INJECTION 76%
INTRAVENOUS | Status: AC
  Filled 2016-08-26: qty 100

## 2016-08-26 MED ORDER — KETOROLAC TROMETHAMINE 15 MG/ML IJ SOLN
15.0000 mg | Freq: Once | INTRAMUSCULAR | Status: AC
  Administered 2016-08-26: 15 mg via INTRAVENOUS
  Filled 2016-08-26: qty 1

## 2016-08-26 NOTE — ED Notes (Signed)
Ambulated Pt, Pts O2 stayed between 93-94 percent.

## 2016-08-26 NOTE — Discharge Instructions (Signed)
Your CT scan of your head and neck showed no abnormalities. Your chest CT scan showed no blood clot. Your heart test has been normal. You are feeling weak because you have pneumonia and have been in the hospital for 4 days. All of your lab work is reassuring. Make sure you take her antibiotics as prescribed along with her steroids. Have given you and albuterol inhaler. Please stop smoking. Follow-up with your primary care doctor this week for recheck. Return to the ED if you develop any worsening symptoms. Make sure you're getting plenty of rest and drink plenty of fluids.

## 2016-08-27 DIAGNOSIS — F17200 Nicotine dependence, unspecified, uncomplicated: Secondary | ICD-10-CM | POA: Insufficient documentation

## 2016-08-27 LAB — CULTURE, BLOOD (ROUTINE X 2)
CULTURE: NO GROWTH
CULTURE: NO GROWTH
SPECIAL REQUESTS: ADEQUATE

## 2017-05-03 ENCOUNTER — Ambulatory Visit (INDEPENDENT_AMBULATORY_CARE_PROVIDER_SITE_OTHER): Payer: Medicaid Other

## 2017-05-03 ENCOUNTER — Ambulatory Visit (HOSPITAL_COMMUNITY)
Admission: EM | Admit: 2017-05-03 | Discharge: 2017-05-03 | Disposition: A | Discharge status: Home / Self Care | Payer: Medicaid Other | Attending: Internal Medicine | Admitting: Internal Medicine

## 2017-05-03 ENCOUNTER — Other Ambulatory Visit: Payer: Self-pay

## 2017-05-03 DIAGNOSIS — M25531 Pain in right wrist: Secondary | ICD-10-CM | POA: Diagnosis not present

## 2017-05-03 DIAGNOSIS — M25521 Pain in right elbow: Secondary | ICD-10-CM | POA: Diagnosis not present

## 2017-05-03 MED ORDER — MELOXICAM 7.5 MG PO TABS: 7.5000 mg | ORAL_TABLET | Freq: Every day | ORAL | 0 refills | Status: DC

## 2017-05-03 NOTE — ED Provider Notes (Signed)
[place]    CSN: [id number] Arrival date & time: 05/03/17  1116     History   Chief Complaint Chief Complaint  Patient presents with  . Fall    HPI Nathaniel Miles is a 66 y.o. male.   66 year old male comes in for right elbow and wrist pain after falling yesterday.  Patient states he slipped on leaves, fell sideways onto his elbow.  Denies head injury, loss of consciousness.  He applied Ace wrap around his arms stating it helped with the swelling.  Has not taken anything for the pain.  States he experiences shooting pain down to his fingertips at times.  He is able to move his elbow and wrist without problems.      Past Medical History:  Diagnosis Date  . DDD (degenerative disc disease), lumbar   . Degenerative disc disease, cervical   . Meningioma ([place])   . 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 Pleuritic chest pain 08/22/2016  . Respiratory insufficiency 08/22/2016  . COPD exacerbation ([place]) 08/22/2016  . Sepsis due to pneumonia ([place]) 08/22/2016  . Hypophosphatemia 08/22/2016  . Abnormal chest x-ray 08/22/2016  . CAP (community acquired pneumonia) 08/21/2016  . Anemia 08/21/2016  . Chronic bronchitis ([place]) 06/26/2015  . Smoker 06/26/2015  . 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     Home Medications    Prior to Admission medications   Medication Sig Start Date End Date Taking? Authorizing Provider  albuterol (PROVENTIL HFA;VENTOLIN HFA) 108 (90 Base) MCG/ACT inhaler Inhale 1-2 puffs into the lungs every 6 (six) hours as needed for wheezing or shortness of breath. 08/26/16   Doristine Devoid, PA-C  azithromycin (ZITHROMAX Z-PAK) 250 MG tablet Take 2 tabs on day, then 1 tab PO daily Patient taking differently: Take 250-500 mg by mouth daily. Pt is to take two tablets by mouth on day 1 and one tablet daily for the next four days.  08/24/16   Verlee Monte, MD  Cariprazine HCl 1.5 MG CAPS Take 1.5 mg by mouth at bedtime.    [provider]  cefUROXime (CEFTIN) 500 MG tablet Take 1 tablet (500 mg total) by mouth 2 (two) times daily with a meal. 08/24/16   Verlee Monte, MD  guaiFENesin (MUCINEX) 600 MG 12 hr tablet Take 2 tablets (1,200 mg total) by mouth 2 (two) times daily. 08/24/16   Verlee Monte, MD  lamoTRIgine (LAMICTAL) 25 MG tablet Take 25 mg by mouth 2 (two) times daily.    [provider]  meloxicam (MOBIC) 7.5 MG tablet Take 1 tablet (7.5 mg total) by mouth daily. 05/03/17   Tasia Catchings, Tierria Watson V, PA-C  predniSONE (STERAPRED UNI-PAK 21 TAB) 10 MG (21) TBPK tablet Take 6-5-4-3-2-1 PO daily till gone Patient taking differently: Take 10-60 mg by mouth daily. Pt is to take as a taper:  Six tablets on day 1, five tablets on day 2, four tablets on day 3, three tablets on day 4, two tablets on day 5, one tablet on day 6, then STOP. 08/24/16   Verlee Monte, MD  venlafaxine XR (EFFEXOR-XR) 37.5 MG 24 hr capsule Take 37.5 mg by mouth daily with breakfast.     [provider]    Family History Family History  Problem Relation Age of Onset  . Hypertension Other     Social History Social History  Tobacco Use  . Smoking status: Current Some Day Smoker  . Smokeless tobacco: Never Used  Substance Use Topics  . Alcohol use: No  . Drug use: No     Allergies   Penicillins and Stadol [butorphanol]   Review of Systems Review of Systems  Reason unable to perform ROS: See HPI as above.     Physical Exam Triage Vital Signs ED Triage Vitals  Enc Vitals Group     BP 05/03/17 1212 (!) 108/92     Pulse Rate 05/03/17 1212 76     Resp --      Temp 05/03/17 1212 97.7 F (36.5 C)     Temp Source 05/03/17 1212 Oral     SpO2 05/03/17 1212 99 %     Weight --      Height --      Head Circumference --      Peak Flow --      Pain Score 05/03/17 1208 8     Pain Loc --      Pain Edu? --      Excl. in  [place]? --    No data found.  Updated Vital Signs BP (!) 108/92 (BP Location: Left Arm)   Pulse 76   Temp 97.7 F (36.5 C) (Oral)   SpO2 99%   Physical Exam  Constitutional: He is oriented to person, place, and time. He appears well-developed and well-nourished. No distress.  HENT:  Head: Normocephalic and atraumatic.  Eyes: Conjunctivae are normal. Pupils are equal, round, and reactive to light.  Musculoskeletal:  No obvious swelling, erythema, increased warmth, contusions.  Tenderness to palpation of medial elbow.  No tenderness on palpation of forearm, wrist, fingers.  Full range of motion of elbow, though with pain.  Strength deferred.  Sensation intact and equal bilaterally.  Radial pulses 2+ and equal bilaterally.  Cap refill less than 2 seconds.  Neurological: He is alert and oriented to person, place, and time.     UC Treatments / Results  Labs (all labs ordered are listed, but only abnormal results are displayed) Labs Reviewed - No data to display  EKG  EKG Interpretation None       Radiology Dg Elbow Complete Right (3+view)  Result Date: 05/03/2017 CLINICAL DATA:  Fall on wet grass the day before imaging, pain in elbow and wrist. EXAM: RIGHT ELBOW - COMPLETE 3+ VIEW COMPARISON:  None. FINDINGS: No elbow effusion or appreciable fracture. Indistinct supinator fat pad. Otherwise unremarkable exam. IMPRESSION: 1. Indistinct supinator fat pad. This can sometimes be a secondary sign of otherwise occult radial head fracture, but in this case may simply be due to obliquity on imaging. 2. No elbow joint effusion is identified. Electronically Signed   By: Van Clines M.D.   On: 05/03/2017 12:29   Dg Wrist Complete Right  Result Date: 05/03/2017 CLINICAL DATA:  Pain after trauma.  Swelling. EXAM: RIGHT WRIST - COMPLETE 3+ VIEW COMPARISON:  None. FINDINGS: There is no evidence of fracture or dislocation. There is no evidence of arthropathy or other focal bone abnormality.  Soft tissues are unremarkable. IMPRESSION: Negative. Electronically Signed   By: Dorise Bullion III M.D   On: 05/03/2017 12:29    Procedures Procedures (including critical care time)  Medications Ordered in UC Medications - No data to display   Initial Impression / Assessment and Plan / UC Course  I have reviewed the triage vital signs and the nursing notes.  Pertinent labs & imaging results that  were available during my care of the patient were reviewed by me and considered in my medical decision making (see chart for details).    Discussed x-ray results with patient, offered posterior splint and follow-up with orthopedics.  Patient would like to defer posterior splint and use sling instead.  Sling provided.  Start Mobic as directed.  Ice compress.  Follow-up with orthopedics for further evaluation and treatment needed.  Return precautions given.  Patient expresses understanding and agrees to plan.  Final Clinical Impressions(s) / UC Diagnoses   Final diagnoses:  Right elbow pain    ED Discharge Orders        Ordered    meloxicam (MOBIC) 7.5 MG tablet  Daily     05/03/17 1328        [place], [place] 05/03/17 1346

## 2017-05-03 NOTE — Discharge Instructions (Signed)
As discussed, possible radial head fracture.  Per your preference, we will put you in a sling until follow-up with orthopedics.  Start Mobic as directed.  Ice compress to help with inflammation and swelling.  Follow-up with orthopedics for further evaluation and treatment needed.

## 2017-05-03 NOTE — ED Triage Notes (Signed)
Per pt he fell on his right arm, per pt his wrist and up to his right elbow hurts,

## 2017-06-16 ENCOUNTER — Ambulatory Visit (HOSPITAL_COMMUNITY)
Admission: RE | Admit: 2017-06-16 | Discharge: 2017-06-16 | Disposition: A | Discharge status: Home / Self Care | Payer: Medicaid Other | Source: Ambulatory Visit | Attending: Nurse Practitioner | Admitting: Nurse Practitioner

## 2017-06-16 ENCOUNTER — Other Ambulatory Visit (HOSPITAL_COMMUNITY): Payer: Self-pay | Admitting: Nurse Practitioner

## 2017-06-16 DIAGNOSIS — J189 Pneumonia, unspecified organism: Secondary | ICD-10-CM

## 2017-06-16 DIAGNOSIS — J181 Lobar pneumonia, unspecified organism: Secondary | ICD-10-CM | POA: Diagnosis not present

## 2017-12-24 ENCOUNTER — Emergency Department (HOSPITAL_COMMUNITY)
Admission: EM | Admit: 2017-12-24 | Discharge: 2017-12-24 | Disposition: A | Discharge status: Home / Self Care | Payer: Medicaid Other | Attending: Emergency Medicine | Admitting: Emergency Medicine

## 2017-12-24 ENCOUNTER — Emergency Department (HOSPITAL_BASED_OUTPATIENT_CLINIC_OR_DEPARTMENT_OTHER): Payer: Medicaid Other

## 2017-12-24 ENCOUNTER — Other Ambulatory Visit: Payer: Self-pay

## 2017-12-24 DIAGNOSIS — L03115 Cellulitis of right lower limb: Secondary | ICD-10-CM | POA: Insufficient documentation

## 2017-12-24 DIAGNOSIS — Z85828 Personal history of other malignant neoplasm of skin: Secondary | ICD-10-CM | POA: Insufficient documentation

## 2017-12-24 DIAGNOSIS — J449 Chronic obstructive pulmonary disease, unspecified: Secondary | ICD-10-CM | POA: Insufficient documentation

## 2017-12-24 DIAGNOSIS — Z79899 Other long term (current) drug therapy: Secondary | ICD-10-CM | POA: Insufficient documentation

## 2017-12-24 DIAGNOSIS — M79604 Pain in right leg: Secondary | ICD-10-CM

## 2017-12-24 DIAGNOSIS — F17200 Nicotine dependence, unspecified, uncomplicated: Secondary | ICD-10-CM | POA: Insufficient documentation

## 2017-12-24 DIAGNOSIS — M79609 Pain in unspecified limb: Secondary | ICD-10-CM | POA: Diagnosis not present

## 2017-12-24 LAB — CBC WITH DIFFERENTIAL/PLATELET
BASOS ABS: 0.1 10*3/uL (ref 0.0–0.1)
BASOS PCT: 1 %
Eosinophils Absolute: 0.4 10*3/uL (ref 0.0–0.7)
Eosinophils Relative: 4 %
HEMATOCRIT: 42.8 % (ref 39.0–52.0)
HEMOGLOBIN: 14 g/dL (ref 13.0–17.0)
Lymphocytes Relative: 39 %
Lymphs Abs: 3.7 10*3/uL (ref 0.7–4.0)
MCH: 30.6 pg (ref 26.0–34.0)
MCHC: 32.7 g/dL (ref 30.0–36.0)
MCV: 93.7 fL (ref 78.0–100.0)
MONO ABS: 0.6 10*3/uL (ref 0.1–1.0)
Monocytes Relative: 7 %
NEUTROS ABS: 4.8 10*3/uL (ref 1.7–7.7)
Neutrophils Relative %: 51 %
Platelets: 268 10*3/uL (ref 150–400)
RBC: 4.57 MIL/uL (ref 4.22–5.81)
RDW: 13.7 % (ref 11.5–15.5)
WBC: 9.5 10*3/uL (ref 4.0–10.5)

## 2017-12-24 LAB — BASIC METABOLIC PANEL
ANION GAP: 10 (ref 5–15)
BUN: 14 mg/dL (ref 8–23)
CALCIUM: 9 mg/dL (ref 8.9–10.3)
CHLORIDE: 108 mmol/L (ref 98–111)
CO2: 24 mmol/L (ref 22–32)
Creatinine, Ser: 0.96 mg/dL (ref 0.61–1.24)
GFR calc non Af Amer: >60 mL/min (ref >60)
GLUCOSE: 86 mg/dL (ref 70–99)
Potassium: 3.7 mmol/L (ref 3.5–5.1)
Sodium: 142 mmol/L (ref 135–145)

## 2017-12-24 MED ORDER — DOXYCYCLINE HYCLATE 100 MG PO CAPS: 100.0000 mg | ORAL_CAPSULE | Freq: Two times a day (BID) | ORAL | 0 refills | Status: DC

## 2017-12-24 MED ORDER — OXYCODONE-ACETAMINOPHEN 5-325 MG PO TABS
2.0000 | ORAL_TABLET | Freq: Once | ORAL | Status: AC
  Administered 2017-12-24: 2 via ORAL
  Filled 2017-12-24: qty 2

## 2017-12-24 NOTE — ED Triage Notes (Signed)
Pt to Ed with c/o of possible blood clot behind right knee. Pt states he was told to come right away to the ED by Doctor. Pt states he needs a right hip replacement but has not yet scheduled procedure. Pts right leg Is edematous, warm to the touch, and pain present in calf. Pt has pedal pulse in right foot +3. Pt denies sob and chest pain. Pt is A&O x4.

## 2017-12-24 NOTE — Discharge Instructions (Signed)
Alternate between ice and warm compresses to affected area throughout the day, elevate your leg to help with pain/swelling. Take antibiotic until it is finished. Alternate between tylenol and motrin as needed for pain; you may also use your home narcotic pain medication as needed for additional pain relief but do not drive or operate machinery with pain medication use. Follow-up with your Primary Care doctor in 5-7 days for recheck of symptoms and ongoing management of your leg pain and possible infection. Monitor area for signs of infection to include, but not limited to: increasing pain, spreading redness, drainage/pus, worsening swelling, or fevers. Return to emergency department for emergent changing or worsening symptoms.

## 2017-12-24 NOTE — Progress Notes (Signed)
RLE venous duplex prelim: negative for DVT and Baker's cyst. Landry Mellow, RDMS, RVT

## 2017-12-24 NOTE — ED Provider Notes (Signed)
[facility] DEPT Provider Note   CSN: [id number] Arrival date & time: 12/24/17  1646     History   Chief Complaint Chief Complaint  Patient presents with  . thrombosis    HPI Nathaniel Miles is a 67 y.o. male with a PMHx of COPD, DDD, meningioma, and other conditions listed below, who presents to the ED with complaints of right leg pain that began 2 weeks ago, he was sent here from his PCPs office to rule out a DVT.  Patient reports that he started having pain behind his right knee about 2 weeks ago and it gradually got worse.  He describes the pain as 8/10 constant throbbing dull tightness from the right posterior knee radiating into the calf and lower leg anteriorly, worse with plantar flexion of the foot, and minimally improved with Percocet 7.5 mg, ibuprofen, and ice/elevation.  He reports associated erythema and warmth to an area on the distal anterior lower leg, as well as swelling in this area.  He denies any known insect bites or injury, no trauma to the leg that would have caused this.  He denies any recent travel/surgery/prolonged immobilization, and denies any personal or family history of DVT/PE.  He denies any red streaking, wounds or drainage, fevers, chills, CP, SOB, abd pain, N/V/D/C, hematuria, dysuria, numbness, tingling, focal weakness, or any other complaints at this time.  His PCP was Dr. Marlou Sa however he retired, so now he's being seen by Mr. Glean Salvo FNP at the same clinic [facility]).   The history is provided by the patient and medical records. No language interpreter was used.    Past Medical History:  Diagnosis Date  . DDD (degenerative disc disease), lumbar   . Degenerative disc disease, cervical   . Meningioma ([place])   . 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 Pleuritic chest pain 08/22/2016  . Respiratory insufficiency 08/22/2016  . COPD  exacerbation ([place]) 08/22/2016  . Sepsis due to pneumonia ([place]) 08/22/2016  . Hypophosphatemia 08/22/2016  . Abnormal chest x-ray 08/22/2016  . CAP (community acquired pneumonia) 08/21/2016  . Anemia 08/21/2016  . Chronic bronchitis ([place]) 06/26/2015  . Smoker 06/26/2015  . 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      Home Medications    Prior to Admission medications   Medication Sig Start Date End Date Taking? Authorizing Provider  albuterol (PROVENTIL HFA;VENTOLIN HFA) 108 (90 Base) MCG/ACT inhaler Inhale 1-2 puffs into the lungs every 6 (six) hours as needed for wheezing or shortness of breath. 08/26/16   Doristine Devoid, PA-C  azithromycin (ZITHROMAX Z-PAK) 250 MG tablet Take 2 tabs on day, then 1 tab PO daily Patient taking differently: Take 250-500 mg by mouth daily. Pt is to take two tablets by mouth on day 1 and one tablet daily for the next four days. 08/24/16   Verlee Monte, MD  Cariprazine HCl 1.5 MG CAPS Take 1.5 mg by mouth at bedtime.    [provider]  cefUROXime (CEFTIN) 500 MG tablet Take 1 tablet (500 mg total) by mouth 2 (two) times daily with a meal. 08/24/16   Verlee Monte, MD  guaiFENesin (MUCINEX) 600 MG 12 hr tablet Take 2 tablets (1,200 mg total) by mouth 2 (two) times daily. 08/24/16   Verlee Monte, MD  lamoTRIgine (LAMICTAL) 25 MG tablet Take 25 mg  by mouth 2 (two) times daily.    [provider]  meloxicam (MOBIC) 7.5 MG tablet Take 1 tablet (7.5 mg total) by mouth daily. 05/03/17   Tasia Catchings, Amy V, PA-C  predniSONE (STERAPRED UNI-PAK 21 TAB) 10 MG (21) TBPK tablet Take 6-5-4-3-2-1 PO daily till gone Patient taking differently: Take 10-60 mg by mouth daily. Pt is to take as a taper:  Six tablets on day 1, five tablets on day 2, four tablets on day 3, three tablets on day 4, two tablets on day 5, one tablet on day 6, then STOP. 08/24/16   Verlee Monte, MD  venlafaxine XR (EFFEXOR-XR) 37.5 MG 24 hr  capsule Take 37.5 mg by mouth daily with breakfast.     [provider]    Family History Family History  Problem Relation Age of Onset  . Hypertension Other     Social History Social History   Tobacco Use  . Smoking status: Current Some Day Smoker  . Smokeless tobacco: Never Used  Substance Use Topics  . Alcohol use: No  . Drug use: No     Allergies   Penicillins and Stadol [butorphanol]   Review of Systems Review of Systems  Constitutional: Negative for chills and fever.  Respiratory: Negative for shortness of breath.   Cardiovascular: Negative for chest pain.  Gastrointestinal: Negative for abdominal pain, constipation, diarrhea, nausea and vomiting.  Genitourinary: Negative for dysuria and hematuria.  Musculoskeletal: Positive for arthralgias, joint swelling (RLE) and myalgias.  Skin: Positive for color change (RLE erythema and warmth). Negative for wound.  Allergic/Immunologic: Negative for immunocompromised state.  Neurological: Negative for weakness and numbness.  Psychiatric/Behavioral: Negative for confusion.   All other systems reviewed and are negative for acute change except as noted in the HPI.    Physical Exam Updated Vital Signs BP 130/88 (BP Location: Left Arm)   Pulse 91   Temp 98 F (36.7 C) (Oral)   Resp 18   Ht 6' (1.829 m)   Wt 87.5 kg   SpO2 94%   BMI 26.18 kg/m   Physical Exam  Constitutional: He is oriented to person, place, and time. Vital signs are normal. He appears well-developed and well-nourished.  Non-toxic appearance. No distress.  Afebrile, nontoxic, NAD  HENT:  Head: Normocephalic and atraumatic.  Mouth/Throat: Oropharynx is clear and moist and mucous membranes are normal.  Eyes: Conjunctivae and EOM are normal. Right eye exhibits no discharge. Left eye exhibits no discharge.  Neck: Normal range of motion. Neck supple.  Cardiovascular: Normal rate, regular rhythm, normal heart sounds and intact distal pulses. Exam  reveals no gallop and no friction rub.  No murmur heard. Pulmonary/Chest: Effort normal and breath sounds normal. No respiratory distress. He has no decreased breath sounds. He has no wheezes. He has no rhonchi. He has no rales.  Abdominal: Soft. Normal appearance and bowel sounds are normal. He exhibits no distension. There is no tenderness. There is no rigidity, no rebound, no guarding, no CVA tenderness, no tenderness at McBurney's point and negative Murphy's sign.  Musculoskeletal: Normal range of motion.       Right knee: He exhibits normal range of motion, no swelling, no effusion, no ecchymosis, no deformity, no laceration, no erythema, normal alignment, no LCL laxity, normal patellar mobility and no MCL laxity. Tenderness (popliteal fossa) found.       Right lower leg: He exhibits tenderness and swelling. He exhibits no deformity and no laceration.  R knee with FROM intact, no joint  line or bony TTP, but with mild TTP to popliteal fossa with ?baker's cyst palpable in the medial aspect overlying the area of tenderness, no swelling/effusion of the knee however some mild swelling to the anterior lower leg just proximal to the ankle with some mild erythema and warmth in this area, no fluctuance or evidence of abscess, mildly TTP in this area as well, no red streaking, no wounds or drainage; no crepitus or deformity, no bruising, no abnormal alignment or patellar mobility in the knee, no varus/valgus laxity in the knee, neg anterior drawer test. No focal bony/joint line TTP of the ankle, and FROM intact in the ankle and all the toes. No calf tenderness, neg homan's sign. No pitting edema noted to the remainder of the RLE or on the LLE. Strength and sensation grossly intact, distal pulses intact, compartments soft   Neurological: He is alert and oriented to person, place, and time. He has normal strength. No sensory deficit.  Skin: Skin is warm, dry and intact. No rash noted.  Psychiatric: He has a  normal mood and affect.  Nursing note and vitals reviewed.    ED Treatments / Results  Labs (all labs ordered are listed, but only abnormal results are displayed) Labs Reviewed  CBC WITH DIFFERENTIAL/PLATELET  BASIC METABOLIC PANEL    EKG None  Radiology No results found.   DVT U/S 12/24/17: Preliminary Report RLE venous duplex prelim: negative for DVT and Baker's cyst. Landry Mellow, RDMS, RVT   Procedures Procedures (including critical care time)  Medications Ordered in ED Medications  oxyCODONE-acetaminophen (PERCOCET/ROXICET) 5-325 MG per tablet 2 tablet (2 tablets Oral Given 12/24/17 1816)     Initial Impression / Assessment and Plan / ED Course  I have reviewed the triage vital signs and the nursing notes.  Pertinent labs & imaging results that were available during my care of the patient were reviewed by me and considered in my medical decision making (see chart for details).     67 y.o. male here with R posterior knee/calf pain x2 wks, some swelling/erythema/warmth to lower leg, sent here for DVT r/o. On exam, mild TTP to popliteal fossa area with ?baker's cyst palpated to medial aspect, no calf tenderness, some mild tenderness to lower anterior leg area with minimal erythema and warmth to this area, no wounds or areas of fluctuance/abscess formation, NVI with soft compartments. Will get DVT U/S to r/o DVT, could be baker's cyst vs cellulitis (although odd to have not had any wounds). Will preemptively get labs in case he has a DVT; doubt need for xray imaging. Will give pain meds and reassess shortly.   7:57 PM CBC w/diff WNL. BMP WNL. DVT U/S negative for DVT or baker's cyst. Given the erythema/warmth/swelling, will treat for possible cellulitis, although strange that he hasn't had any injury/wounds to the area to cause this. Advised RICE/heat use, tylenol/motrin or use of home pain meds for pain control, will send home with abx, and advised f/up with PCP in 5-7 days  for recheck of symptoms and ongoing management of this issue. I explained the diagnosis and have given explicit precautions to return to the ER including for any other new or worsening symptoms. The patient understands and accepts the medical plan as it's been dictated and I have answered their questions. Discharge instructions concerning home care and prescriptions have been given. The patient is STABLE and is discharged to home in good condition.    Final Clinical Impressions(s) / ED Diagnoses   Final  diagnoses:  Right leg pain  Cellulitis of right lower extremity    ED Discharge Orders         Ordered    doxycycline (VIBRAMYCIN) 100 MG capsule  2 times daily     12/24/17 [address] [place], [place] 12/24/17 1958    Maudie Flakes, MD 12/25/17 [phone]

## 2018-06-01 IMAGING — DX DG CHEST 2V
2 series · 2 of 2 positions shown · non-contrast
Comparison: CT chest of 08/26/2016 and chest x-ray of 08/25/2016

CLINICAL DATA: Recent coughing congestion, history of recent
pneumonia as well, fatigue

EXAM:
CHEST  2 VIEW

[chest pa]
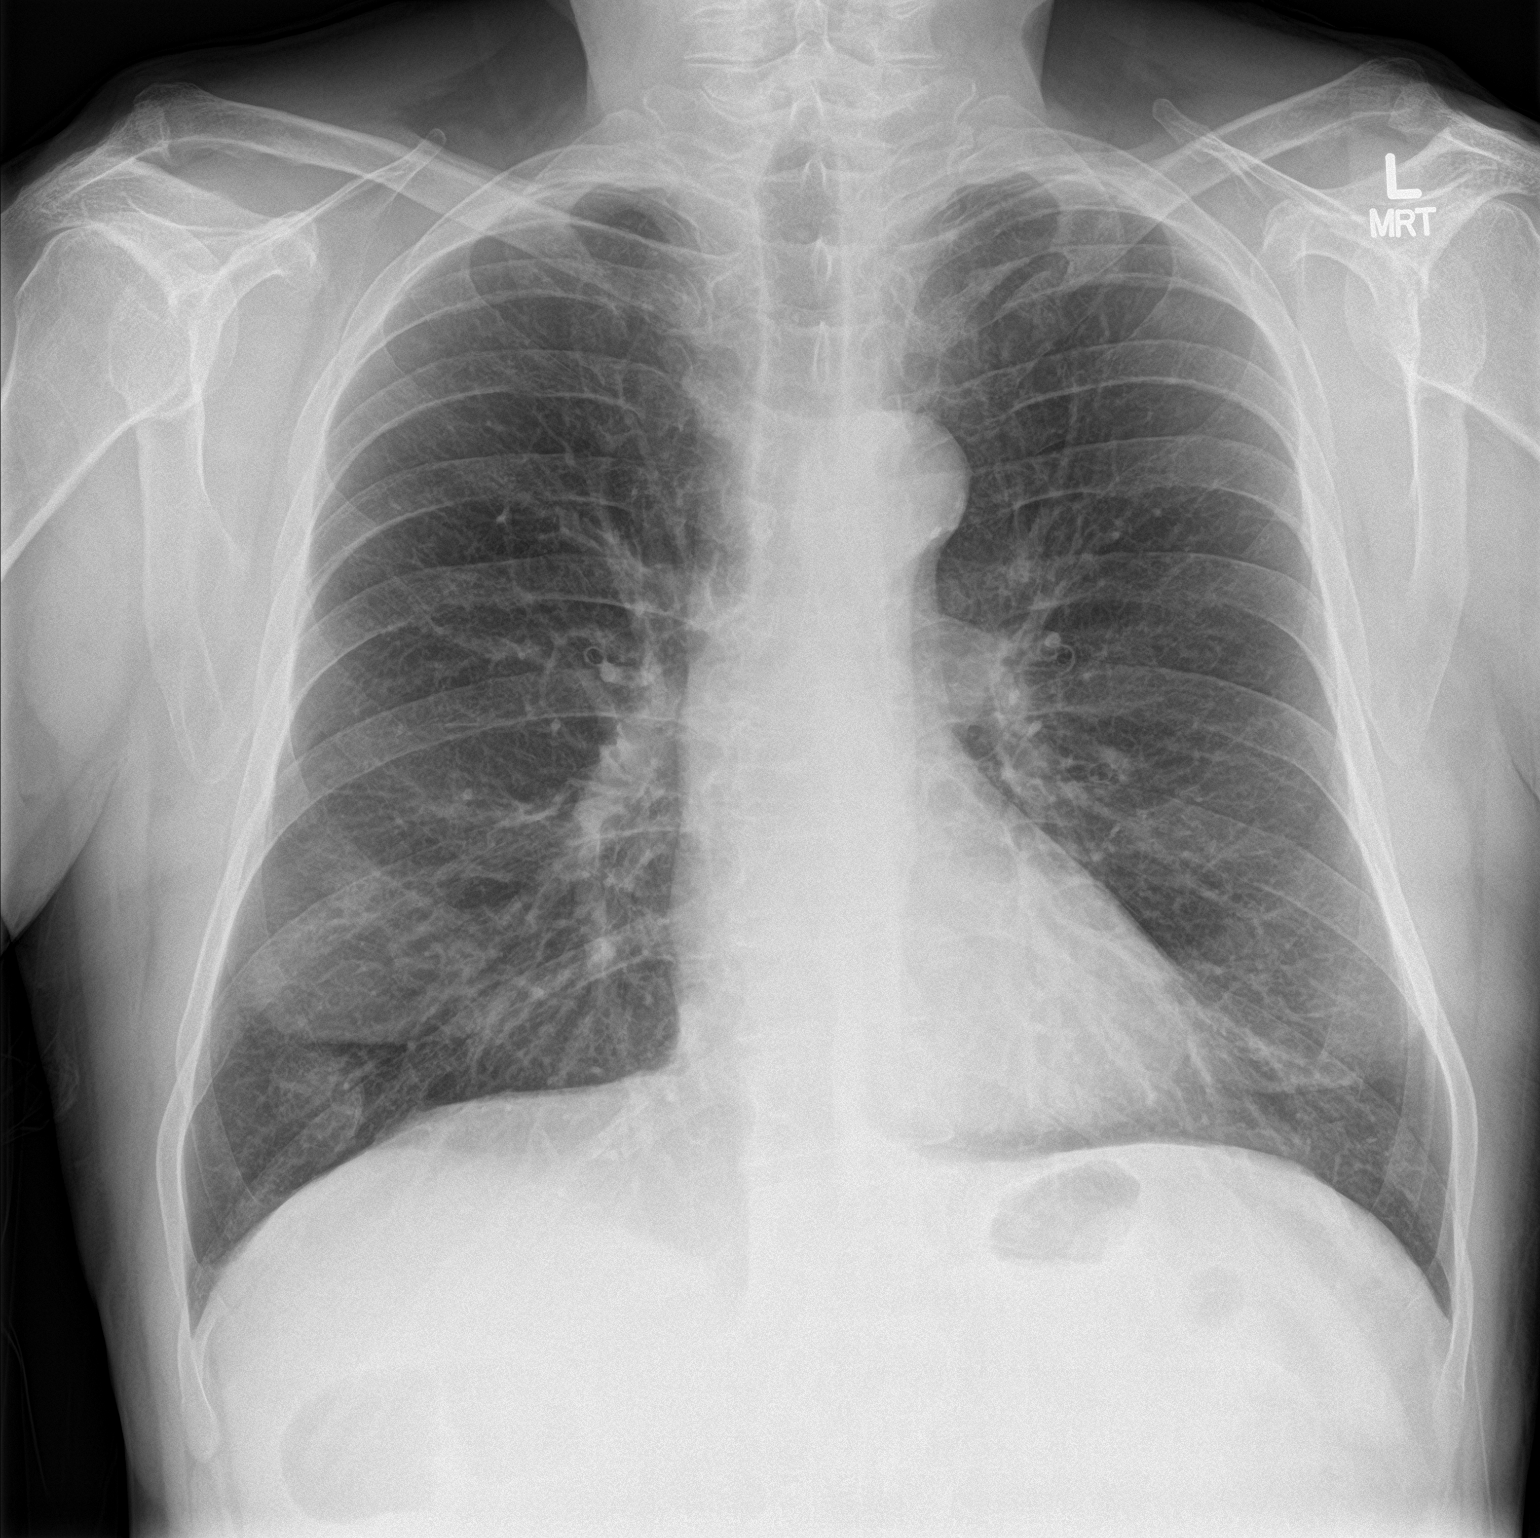

[chest lat]
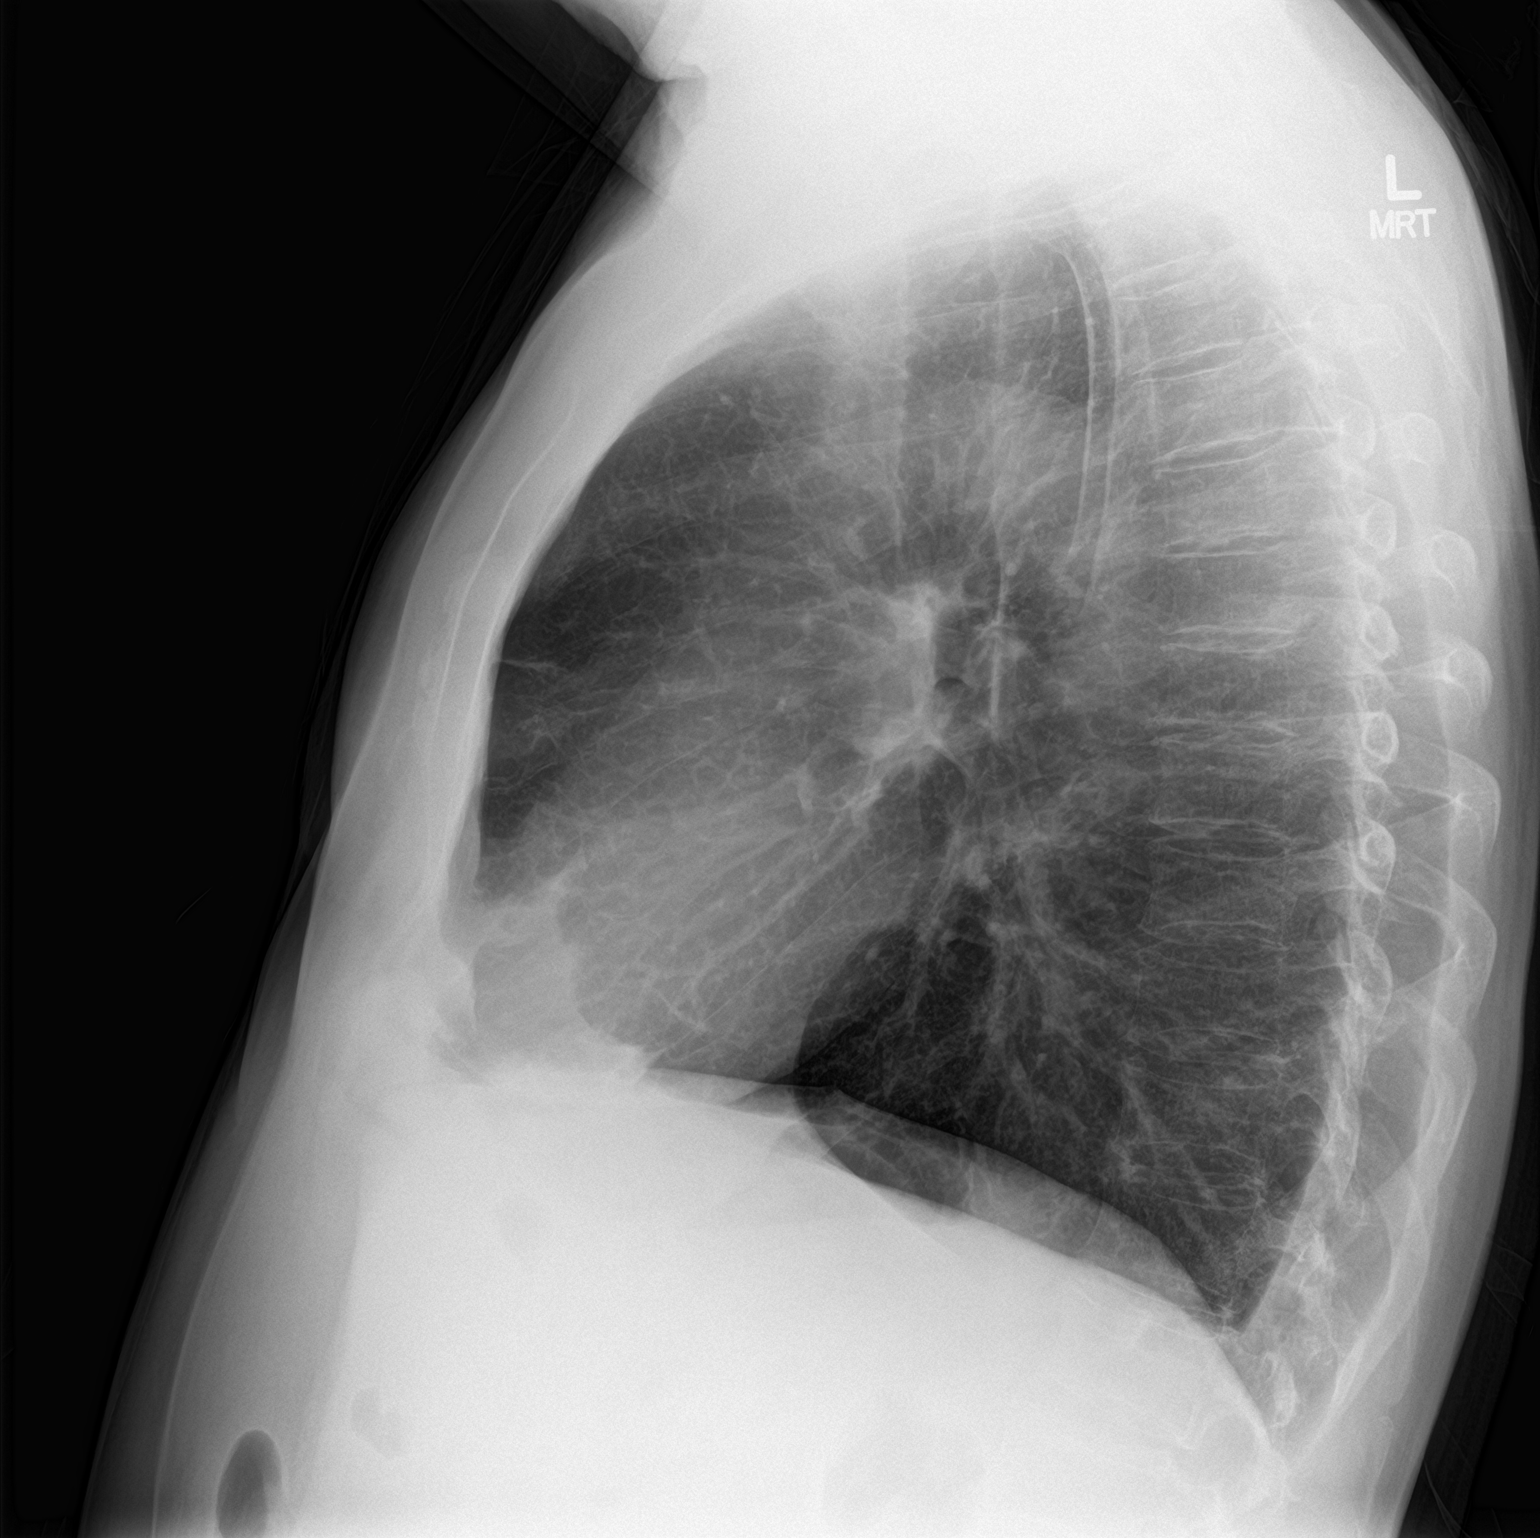

[2 of 2 positions shown; findings below may reference images not displayed]

FINDINGS: The previous pneumonia in the superior segment right lower lobe has
cleared. The lungs remain somewhat hyperaerated. No present
pneumonia or pleural effusion is seen. Mediastinal and hilar
contours are unremarkable. The heart is within normal limits in
size. No bony abnormality is noted.
IMPRESSION: Clearing of superior segment right lower lobe pneumonia. No definite
active process.

## 2019-08-27 DIAGNOSIS — M79644 Pain in right finger(s): Secondary | ICD-10-CM | POA: Insufficient documentation

## 2022-05-21 DIAGNOSIS — J449 Chronic obstructive pulmonary disease, unspecified: Secondary | ICD-10-CM | POA: Insufficient documentation

## 2022-07-31 DIAGNOSIS — L703 Acne tropica: Secondary | ICD-10-CM | POA: Insufficient documentation

## 2022-07-31 DIAGNOSIS — Z7409 Other reduced mobility: Secondary | ICD-10-CM | POA: Insufficient documentation

## 2022-07-31 DIAGNOSIS — M5030 Other cervical disc degeneration, unspecified cervical region: Secondary | ICD-10-CM | POA: Insufficient documentation

## 2022-07-31 DIAGNOSIS — F32A Depression, unspecified: Secondary | ICD-10-CM | POA: Insufficient documentation

## 2022-07-31 DIAGNOSIS — M1611 Unilateral primary osteoarthritis, right hip: Secondary | ICD-10-CM | POA: Insufficient documentation

## 2022-07-31 DIAGNOSIS — N529 Male erectile dysfunction, unspecified: Secondary | ICD-10-CM | POA: Insufficient documentation

## 2022-07-31 DIAGNOSIS — G8929 Other chronic pain: Secondary | ICD-10-CM | POA: Insufficient documentation

## 2022-07-31 DIAGNOSIS — F1120 Opioid dependence, uncomplicated: Secondary | ICD-10-CM | POA: Insufficient documentation

## 2023-01-12 ENCOUNTER — Encounter (HOSPITAL_COMMUNITY): Payer: Self-pay

## 2023-01-12 ENCOUNTER — Ambulatory Visit (HOSPITAL_COMMUNITY)
Admission: EM | Admit: 2023-01-12 | Discharge: 2023-01-12 | Disposition: A | Discharge status: Home / Self Care | Payer: 59 | Attending: Family Medicine | Admitting: Family Medicine

## 2023-01-12 ENCOUNTER — Other Ambulatory Visit: Payer: Self-pay

## 2023-01-12 ENCOUNTER — Ambulatory Visit (INDEPENDENT_AMBULATORY_CARE_PROVIDER_SITE_OTHER): Payer: 59

## 2023-01-12 DIAGNOSIS — J441 Chronic obstructive pulmonary disease with (acute) exacerbation: Secondary | ICD-10-CM | POA: Diagnosis not present

## 2023-01-12 DIAGNOSIS — R5383 Other fatigue: Secondary | ICD-10-CM | POA: Insufficient documentation

## 2023-01-12 LAB — BASIC METABOLIC PANEL
Anion gap: 11 (ref 5–15)
BUN: 14 mg/dL (ref 8–23)
CO2: 26 mmol/L (ref 22–32)
Calcium: 8.7 mg/dL — ABNORMAL LOW (ref 8.9–10.3)
Chloride: 97 mmol/L — ABNORMAL LOW (ref 98–111)
Creatinine, Ser: 1.12 mg/dL (ref 0.61–1.24)
GFR, Estimated: >60 mL/min (ref >60)
Glucose, Bld: 102 mg/dL — ABNORMAL HIGH (ref 70–99)
Potassium: 3.5 mmol/L (ref 3.5–5.1)
Sodium: 134 mmol/L — ABNORMAL LOW (ref 135–145)

## 2023-01-12 LAB — CBC
HCT: 34.6 % — ABNORMAL LOW (ref 39.0–52.0)
Hemoglobin: 11.3 g/dL — ABNORMAL LOW (ref 13.0–17.0)
MCH: 31.2 pg (ref 26.0–34.0)
MCHC: 32.7 g/dL (ref 30.0–36.0)
MCV: 95.6 fL (ref 80.0–100.0)
Platelets: 342 10*3/uL (ref 150–400)
RBC: 3.62 MIL/uL — ABNORMAL LOW (ref 4.22–5.81)
RDW: 14.3 % (ref 11.5–15.5)
WBC: 7.1 10*3/uL (ref 4.0–10.5)
nRBC: 0 % (ref 0.0–0.2)

## 2023-01-12 LAB — POCT FASTING CBG KUC MANUAL ENTRY: POCT Glucose (KUC): 114 mg/dL — AB (ref 70–99)

## 2023-01-12 MED ORDER — PREDNISONE 50 MG PO TABS: ORAL_TABLET | ORAL | 0 refills | Status: AC

## 2023-01-12 MED ORDER — AZITHROMYCIN 250 MG PO TABS: 250.0000 mg | ORAL_TABLET | Freq: Every day | ORAL | 0 refills | Status: AC

## 2023-01-12 MED ORDER — BENZONATATE 200 MG PO CAPS: ORAL_CAPSULE | ORAL | 0 refills | Status: AC

## 2023-01-12 NOTE — ED Notes (Signed)
No answer in lobby.

## 2023-01-12 NOTE — ED Triage Notes (Signed)
Patient c/o SOB, headache, nausea, fatigue, dizziness x 1 week.  Patient states he has taken Dayquil/Nyquil and Equate Ibuprofen and cough drops.

## 2023-01-13 NOTE — ED Provider Notes (Signed)
[facility]   [id number] 01/12/23 Arrival Time: 1613  ASSESSMENT & PLAN:  1. COPD exacerbation (HCC)   2. Other fatigue    I have personally viewed and independently interpreted the imaging studies ordered this visit. CXR: no acute changes; no PNA.  Begim: Meds ordered this encounter  Medications   azithromycin (ZITHROMAX) 250 MG tablet    Sig: Take 1 tablet (250 mg total) by mouth daily. Take first 2 tablets together, then 1 every day until finished.    Dispense:  6 tablet    Refill:  0   predniSONE (DELTASONE) 50 MG tablet    Sig: Take one tablet by mouth for 5 days.    Dispense:  5 tablet    Refill:  0   benzonatate (TESSALON) 200 MG capsule    Sig: Take 1 capsule by mouth every 8 (eight) hours for cough.    Dispense:  30 capsule    Refill:  0   Results for orders placed or performed during the hospital encounter of 01/12/23  CBC  Result Value Ref Range   WBC 7.1 4.0 - 10.5 K/uL   RBC 3.62 (L) 4.22 - 5.81 MIL/uL   Hemoglobin 11.3 (L) 13.0 - 17.0 g/dL   HCT 16.6 (L) 06.3 - 01.6 %   MCV 95.6 80.0 - 100.0 fL   MCH 31.2 26.0 - 34.0 pg   MCHC 32.7 30.0 - 36.0 g/dL   RDW 01.0 93.2 - 35.5 %   Platelets 342 150 - 400 K/uL   nRBC 0.0 0.0 - 0.2 %  Basic metabolic panel  Result Value Ref Range   Sodium 134 (L) 135 - 145 mmol/L   Potassium 3.5 3.5 - 5.1 mmol/L   Chloride 97 (L) 98 - 111 mmol/L   CO2 26 22 - 32 mmol/L   Glucose, Bld 102 (H) 70 - 99 mg/dL   BUN 14 8 - 23 mg/dL   Creatinine, Ser 7.32 0.61 - 1.24 mg/dL   Calcium 8.7 (L) 8.9 - 10.3 mg/dL   GFR, Estimated >20 >25 mL/min   Anion gap 11 5 - 15  POC CBG monitoring  Result Value Ref Range   POCT Glucose (KUC) 114 (A) 70 - 99 mg/dL   Hg around baseline.  Agrees to ED f/u should his symptoms worsen.  Reviewed expectations re: course of current medical issues. Questions answered. Outlined signs and symptoms indicating need for more acute intervention. Patient verbalized understanding. After Visit  Summary given.  SUBJECTIVE: History from: patient.  Nathaniel Miles is a 72 y.o. male with h/o COPD/chronic bronchitis who presents with complaint of productive cough, SOB, and feeling fatigued. Gradual onset over past week he thinks. Occas lightheadedness. Denies CP/n/v. Is ambulatory here without assistance. OTC cough syrup without help.  Social History   Tobacco Use  Smoking Status Some Days  Smokeless Tobacco Never   OBJECTIVE:  Vitals:   01/12/23 1732  BP: 117/71  Pulse: 74  Resp: 16  Temp: 97.9 F (36.6 C)  TempSrc: Oral  SpO2: 94%    General appearance: alert, appears fatigued HEENT: [place]; AT; with mild nasal congestion Neck: supple without LAD Cv: RRR without murmer Lungs: unlabored respirations, moderate bilateral expiratory wheezing; cough: is present; no significant respiratory distress Skin: warm and dry Psychological: alert and cooperative; normal mood and affect  Imaging: DG Chest 2 View  Result Date: 01/12/2023 CLINICAL DATA:  Shortness of breath.  Headache. EXAM: CHEST - 2 VIEW COMPARISON:  chest radiographs 06/16/2017 and 08/25/2016;  CT chest 08/22/2016 FINDINGS: Cardiac silhouette and mediastinal contours are within normal limits. Mild calcification within the aortic arch. There is again flattening of the diaphragms and moderate hyperinflation. The lungs appear clear. No pleural effusion or pneumothorax. Mild multilevel degenerative disc changes of the thoracic spine. IMPRESSION: 1. No active cardiopulmonary disease. 2. Moderate hyperinflation. Electronically Signed   By: Neita Garnet M.D.   On: 01/12/2023 19:20    Allergies  Allergen Reactions   Penicillins Other (See Comments)    Family hx of anaphylaxis, but can tolerate cephalosporins Has patient had a PCN reaction causing immediate rash, facial/tongue/throat swelling, SOB or lightheadedness with hypotension: No Has patient had a PCN reaction causing severe rash involving mucus membranes or skin necrosis:  No Has patient had a PCN reaction that required hospitalization Yes Has patient had a PCN reaction occurring within the last 10 years: No If all of the above answers are "NO", then may proceed with Cephalosporin use.    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)    Family History  Problem Relation Age of Onset   Hypertension Other    Social History   Socioeconomic History   Marital status: Single    Spouse name: Not on file   Number of children: Not on file   Years of education: Not on file   Highest education level: Not on file  Occupational History   Not on file  Tobacco Use   Smoking status: Some Days   Smokeless tobacco: Never  Vaping Use   Vaping status: Never Used  Substance and Sexual Activity   Alcohol use: No   Drug use: No   Sexual activity: Not on file  Other Topics Concern   Not on file  Social History Narrative   Not on file   Social Determinants of Health   Financial Resource Strain: Not at Risk (07/31/2022)   Received from [employer]    Financial Resource Strain: 1  Food Insecurity: Not at Risk (07/31/2022)   Received from [facility] Insecurity    Food: 1  Transportation Needs: Not on File (08/28/2021)   Received from [employer] Needs    Transportation: 0  Physical Activity: Not at Risk (07/31/2022)   Received from [facility]   Physical Activity    Physical Activity: 1  Stress: Not at Risk (07/31/2022)   Received from [facility]   Stress    Stress: 1  Social Connections: Not at Risk (07/31/2022)   Received from [facility]   Social Connections    Social Connections and Isolation: 1  Intimate Partner Violence: Not on file             [place], MD 01/13/23 1301

## 2023-02-16 DIAGNOSIS — G9331 Postviral fatigue syndrome: Secondary | ICD-10-CM | POA: Insufficient documentation

## 2023-02-16 DIAGNOSIS — J301 Allergic rhinitis due to pollen: Secondary | ICD-10-CM | POA: Insufficient documentation

## 2023-04-23 ENCOUNTER — Other Ambulatory Visit: Payer: Self-pay

## 2023-04-23 ENCOUNTER — Emergency Department (HOSPITAL_COMMUNITY)
Admission: EM | Admit: 2023-04-23 | Discharge: 2023-04-24 | Disposition: A | Discharge status: Home / Self Care | Payer: 59 | Attending: Emergency Medicine | Admitting: Emergency Medicine

## 2023-04-23 ENCOUNTER — Encounter (HOSPITAL_COMMUNITY): Payer: Self-pay

## 2023-04-23 ENCOUNTER — Emergency Department (HOSPITAL_COMMUNITY): Payer: 59

## 2023-04-23 DIAGNOSIS — M79662 Pain in left lower leg: Secondary | ICD-10-CM | POA: Diagnosis present

## 2023-04-23 DIAGNOSIS — Z23 Encounter for immunization: Secondary | ICD-10-CM | POA: Diagnosis not present

## 2023-04-23 DIAGNOSIS — W19XXXA Unspecified fall, initial encounter: Secondary | ICD-10-CM | POA: Diagnosis not present

## 2023-04-23 DIAGNOSIS — L03116 Cellulitis of left lower limb: Secondary | ICD-10-CM | POA: Diagnosis not present

## 2023-04-23 DIAGNOSIS — M79605 Pain in left leg: Secondary | ICD-10-CM | POA: Diagnosis present

## 2023-04-23 DIAGNOSIS — J449 Chronic obstructive pulmonary disease, unspecified: Secondary | ICD-10-CM | POA: Diagnosis not present

## 2023-04-23 DIAGNOSIS — M7989 Other specified soft tissue disorders: Secondary | ICD-10-CM | POA: Diagnosis not present

## 2023-04-23 LAB — CBC WITH DIFFERENTIAL/PLATELET
Abs Immature Granulocytes: 0.02 10*3/uL (ref 0.00–0.07)
Basophils Absolute: 0.1 10*3/uL (ref 0.0–0.1)
Basophils Relative: 1 %
Eosinophils Absolute: 0.2 10*3/uL (ref 0.0–0.5)
Eosinophils Relative: 3 %
HCT: 39.3 % (ref 39.0–52.0)
Hemoglobin: 12.3 g/dL — ABNORMAL LOW (ref 13.0–17.0)
Immature Granulocytes: 0 %
Lymphocytes Relative: 32 %
Lymphs Abs: 2.5 10*3/uL (ref 0.7–4.0)
MCH: 32.7 pg (ref 26.0–34.0)
MCHC: 31.3 g/dL (ref 30.0–36.0)
MCV: 104.5 fL — ABNORMAL HIGH (ref 80.0–100.0)
Monocytes Absolute: 0.7 10*3/uL (ref 0.1–1.0)
Monocytes Relative: 9 %
Neutro Abs: 4.2 10*3/uL (ref 1.7–7.7)
Neutrophils Relative %: 55 %
Platelets: 239 10*3/uL (ref 150–400)
RBC: 3.76 MIL/uL — ABNORMAL LOW (ref 4.22–5.81)
RDW: 14 % (ref 11.5–15.5)
WBC: 7.7 10*3/uL (ref 4.0–10.5)
nRBC: 0 % (ref 0.0–0.2)

## 2023-04-23 LAB — BASIC METABOLIC PANEL
Anion gap: 7 (ref 5–15)
BUN: 20 mg/dL (ref 8–23)
CO2: 24 mmol/L (ref 22–32)
Calcium: 8.7 mg/dL — ABNORMAL LOW (ref 8.9–10.3)
Chloride: 100 mmol/L (ref 98–111)
Creatinine, Ser: 1.08 mg/dL (ref 0.61–1.24)
GFR, Estimated: >60 mL/min (ref >60)
Glucose, Bld: 75 mg/dL (ref 70–99)
Potassium: 3.6 mmol/L (ref 3.5–5.1)
Sodium: 131 mmol/L — ABNORMAL LOW (ref 135–145)

## 2023-04-23 LAB — LACTIC ACID, PLASMA: Lactic Acid, Venous: 1.2 mmol/L (ref 0.5–1.9)

## 2023-04-23 NOTE — ED Triage Notes (Addendum)
C/o Swelling, redness, and heat to right lower extremity into foot with bruising noted x2 weeks.  Denies blood thinner usage.  Denies fall.  Ambulatory to triage

## 2023-04-23 NOTE — ED Provider Triage Note (Signed)
Emergency Medicine Provider Triage Evaluation Note  Nathaniel Miles , a 72 y.o. male  was evaluated in triage.  Pt complains of redness, swelling, and pain to his left lower leg.  He reports that 2 weeks ago he tripped and fell striking his shin on the concrete.  It immediately swelled up, but he has been watching it at home.  He is concerned now that his leg is red, warm, and swollen.  He also began having ecchymosis to 3 of his toes on his left foot that began 2 days ago.  Denies blood thinner usage.  History of tobacco use.  Review of Systems  Positive: As above Negative: As above  Physical Exam  BP (!) 129/100   Pulse 83   Temp 98 F (36.7 C)   Resp 20   Ht 6' (1.829 m)   Wt 87 kg   SpO2 98%   BMI 26.01 kg/m  Gen:   Awake, no distress   Resp:  Normal effort  MSK:   Moves extremities without difficulty  Other:  Erythema, swelling, and increased warmth to left lower extremity extending from below knee to the ankle; ecchymosis to the left 2nd-4th toes.  Significant anterior swelling over the anterior, superior left lower leg  Medical Decision Making  Medically screening exam initiated at 9:17 PM.  Appropriate orders placed.  Exequiel Zilka was informed that the remainder of the evaluation will be completed by another provider, this initial triage assessment does not replace that evaluation, and the importance of remaining in the ED until their evaluation is complete.     Lenard Simmer, [place] 04/23/23 2122

## 2023-04-24 ENCOUNTER — Ambulatory Visit (HOSPITAL_BASED_OUTPATIENT_CLINIC_OR_DEPARTMENT_OTHER)
Admission: RE | Admit: 2023-04-24 | Discharge: 2023-04-24 | Disposition: A | Discharge status: Home / Self Care | Payer: 59 | Source: Ambulatory Visit | Attending: Emergency Medicine | Admitting: Emergency Medicine

## 2023-04-24 ENCOUNTER — Telehealth (HOSPITAL_COMMUNITY): Payer: Self-pay

## 2023-04-24 DIAGNOSIS — M7989 Other specified soft tissue disorders: Secondary | ICD-10-CM | POA: Insufficient documentation

## 2023-04-24 DIAGNOSIS — L03116 Cellulitis of left lower limb: Secondary | ICD-10-CM | POA: Diagnosis not present

## 2023-04-24 DIAGNOSIS — M79662 Pain in left lower leg: Secondary | ICD-10-CM | POA: Insufficient documentation

## 2023-04-24 DIAGNOSIS — W19XXXA Unspecified fall, initial encounter: Secondary | ICD-10-CM | POA: Insufficient documentation

## 2023-04-24 MED ORDER — DOXYCYCLINE HYCLATE 100 MG PO CAPS: 100.0000 mg | ORAL_CAPSULE | Freq: Two times a day (BID) | ORAL | 0 refills | Status: AC

## 2023-04-24 MED ORDER — LIDOCAINE-EPINEPHRINE (PF) 2 %-1:200000 IJ SOLN
10.0000 mL | Freq: Once | INTRAMUSCULAR | Status: DC
  Filled 2023-04-24: qty 20

## 2023-04-24 MED ORDER — TETANUS-DIPHTH-ACELL PERTUSSIS 5-2.5-18.5 LF-MCG/0.5 IM SUSY
0.5000 mL | PREFILLED_SYRINGE | Freq: Once | INTRAMUSCULAR | Status: AC
  Administered 2023-04-24: 0.5 mL via INTRAMUSCULAR
  Filled 2023-04-24: qty 0.5

## 2023-04-24 MED ORDER — DOXYCYCLINE HYCLATE 100 MG PO TABS
100.0000 mg | ORAL_TABLET | Freq: Once | ORAL | Status: AC
  Administered 2023-04-24: 100 mg via ORAL
  Filled 2023-04-24: qty 1

## 2023-04-24 NOTE — ED Provider Notes (Signed)
[place] EMERGENCY DEPARTMENT AT [facility] Provider Note   CSN: [id number] Arrival date & time: 04/23/23  2039     History  Chief Complaint  Patient presents with   Leg Pain    Nathaniel Miles is a 72 y.o. male.   Leg Pain 72 year old male history of COPD presenting for left leg pain.  He states about 2 weeks ago he fell and hit his left shin.  He did not hit his head at the time.  Since then he has developed redness, swelling to the left leg.  Is been worse over the last week and has progressively worsened.  No fevers or chills.  He has no headache or neck pain or chest pain or back pain or abdominal pain.  No additional falls since.  Has not been on antibiotics recently.  No history of DVT or PE but does have swelling distal to the area of redness to his left foot.  He has some pain to his left foot as well and bruising here.  He is not on anticoagulation.     Home Medications Prior to Admission medications   Medication Sig Start Date End Date Taking? Authorizing Provider  doxycycline (VIBRAMYCIN) 100 MG capsule Take 1 capsule (100 mg total) by mouth 2 (two) times daily. 04/24/23  Yes Laurence Spates, MD  albuterol (PROVENTIL HFA;VENTOLIN HFA) 108 (90 Base) MCG/ACT inhaler Inhale 1-2 puffs into the lungs every 6 (six) hours as needed for wheezing or shortness of breath. Patient not taking: Reported on 12/24/2017 08/26/16   Demetrios Loll T, PA-C  Ascorbic Acid (VITAMIN C PO) Take 1 tablet by mouth daily.    [provider]  azithromycin (ZITHROMAX) 250 MG tablet Take 1 tablet (250 mg total) by mouth daily. Take first 2 tablets together, then 1 every day until finished. 01/12/23   Mardella Layman, MD  benzonatate (TESSALON) 200 MG capsule Take 1 capsule by mouth every 8 (eight) hours for cough. 01/12/23   Mardella Layman, MD  Cholecalciferol ([employer] D3) 2000 units CAPS Take 2,000 Units by mouth daily.    [provider]  doxycycline (VIBRAMYCIN) 100 MG  capsule Take 1 capsule (100 mg total) by mouth 2 (two) times daily. 04/24/23   Durwin Glaze, MD  gabapentin (NEURONTIN) 300 MG capsule Take 300 mg by mouth at bedtime. 11/25/17   [provider]  Multiple Vitamin (MULTI-VITAMINS) TABS Take 1 tablet by mouth daily.    [provider]  oxyCODONE-acetaminophen (PERCOCET) 7.5-325 MG tablet Take 1 tablet by mouth 3 (three) times daily. for 7 days 12/17/17   [provider]  predniSONE (DELTASONE) 50 MG tablet Take one tablet by mouth for 5 days. 01/12/23   Mardella Layman, MD  venlafaxine XR (EFFEXOR-XR) 37.5 MG 24 hr capsule Take 37.5 mg by mouth daily with breakfast.     [provider]      Allergies    Amoxicillin-pot clavulanate, Butorphanol, Penicillins, and Pregabalin    Review of Systems   Review of Systems Review of systems completed and notable as per HPI.  ROS otherwise negative.   Physical Exam Updated Vital Signs BP (!) 150/90   Pulse 68   Temp 98 F (36.7 C) (Oral)   Resp 16   Ht 6' (1.829 m)   Wt 87 kg   SpO2 94%   BMI 26.01 kg/m  Physical Exam Vitals and nursing note reviewed.  Constitutional:      General: He is not in acute distress.  Appearance: He is well-developed.  HENT:     Head: Normocephalic and atraumatic.  Eyes:     Conjunctiva/sclera: Conjunctivae normal.  Cardiovascular:     Rate and Rhythm: Normal rate and regular rhythm.     Pulses: Normal pulses.     Heart sounds: Normal heart sounds. No murmur heard. Pulmonary:     Effort: Pulmonary effort is normal. No respiratory distress.     Breath sounds: Normal breath sounds.  Abdominal:     Palpations: Abdomen is soft.     Tenderness: There is no abdominal tenderness.  Musculoskeletal:        General: No swelling.     Cervical back: Neck supple.     Right lower leg: No edema.     Left lower leg: Edema present.     Comments: Mild bruising and swelling to the left foot without erythema or warmth.  He has an area of  induration and possible fluctuance over the left anterior tibia.  No crepitus.  No erythema or warmth or tracking above the knee.  2+ DP and PT pulse.  Skin:    General: Skin is warm and dry.     Capillary Refill: Capillary refill takes less than 2 seconds.  Neurological:     Mental Status: He is alert.  Psychiatric:        Mood and Affect: Mood normal.     ED Results / Procedures / Treatments   Labs (all labs ordered are listed, but only abnormal results are displayed) Labs Reviewed  CBC WITH DIFFERENTIAL/PLATELET - Abnormal; Notable for the following components:      Result Value   RBC 3.76 (*)    Hemoglobin 12.3 (*)    MCV 104.5 (*)    All other components within normal limits  BASIC METABOLIC PANEL - Abnormal; Notable for the following components:   Sodium 131 (*)    Calcium 8.7 (*)    All other components within normal limits  LACTIC ACID, PLASMA    EKG None  Radiology LE Venous Result Date: 04/24/2023  Lower Venous DVT Study Patient Name:  Nathaniel Miles  Date of Exam:   04/24/2023 Medical Rec #: [id number]      Accession #:    [id number] Date of Birth: 1951-02-27     Patient Gender: M Patient Age:   5 years Exam Location:  [facility] Procedure:      VAS [place] LOWER EXTREMITY VENOUS (DVT) Referring Phys: Marja Kays Brecklynn Jian --------------------------------------------------------------------------------  Indications: Pain, Swelling, and Hematoma on shin status post fall.  Comparison Study: No prior Left LEV on file Performing Technologist: Sherren Kerns RVS  Examination Guidelines: A complete evaluation includes B-mode imaging, spectral Doppler, color Doppler, and power Doppler as needed of all accessible portions of each vessel. Bilateral testing is considered an integral part of a complete examination. Limited examinations for reoccurring indications may be performed as noted. The reflux portion of the exam is performed with the patient in reverse Trendelenburg.   +-----+---------------+---------+-----------+----------+--------------+ RIGHTCompressibilityPhasicitySpontaneityPropertiesThrombus Aging +-----+---------------+---------+-----------+----------+--------------+ CFV  Full           Yes      Yes                                 +-----+---------------+---------+-----------+----------+--------------+   +---------+---------------+---------+-----------+----------+--------------+ LEFT     CompressibilityPhasicitySpontaneityPropertiesThrombus Aging +---------+---------------+---------+-----------+----------+--------------+ CFV      Full           Yes  Yes                                 +---------+---------------+---------+-----------+----------+--------------+ SFJ      Full                                                        +---------+---------------+---------+-----------+----------+--------------+ FV Prox  Full                                                        +---------+---------------+---------+-----------+----------+--------------+ FV Mid   Full                                                        +---------+---------------+---------+-----------+----------+--------------+ FV DistalFull                                                        +---------+---------------+---------+-----------+----------+--------------+ PFV      Full                                                        +---------+---------------+---------+-----------+----------+--------------+ POP      Full           Yes      Yes                                 +---------+---------------+---------+-----------+----------+--------------+ PTV      Full                                                        +---------+---------------+---------+-----------+----------+--------------+ PERO     Full                                                         +---------+---------------+---------+-----------+----------+--------------+ Gastroc  Full                                                        +---------+---------------+---------+-----------+----------+--------------+ ATV      Full                                                        +---------+---------------+---------+-----------+----------+--------------+  Hypoechoic area noted at the area of concern, possibly consistent with hematoma   Summary: RIGHT: - No evidence of common femoral vein obstruction.   LEFT: - There is no evidence of deep vein thrombosis in the lower extremity.  - No cystic structure found in the popliteal fossa. Hypoechoic area noted at the area of concern, possibly consistent with hematoma.  *See table(s) above for measurements and observations.    Preliminary    DG Foot Complete Left Result Date: 04/23/2023 CLINICAL DATA:  Fall, swelling. EXAM: LEFT FOOT - COMPLETE 3 VIEW COMPARISON:  None Available. FINDINGS: There is no evidence of fracture or dislocation. There is no evidence of arthropathy or other focal bone abnormality. Soft tissues are unremarkable. IMPRESSION: Negative. Electronically Signed   By: Layla Maw M.D.   On: 04/23/2023 23:35   DG Tibia/Fibula Left Result Date: 04/23/2023 CLINICAL DATA:  Fall, swelling. EXAM: LEFT TIBIA AND FIBULA - 2 VIEW COMPARISON:  None Available. FINDINGS: There is no evidence of fracture or other focal bone lesions. There is soft tissue swelling of the ankle mortise. IMPRESSION: Soft tissue swelling. No acute osseous abnormalities. Electronically Signed   By: Layla Maw M.D.   On: 04/23/2023 23:34    Procedures .Incision and Drainage  Date/Time: 04/24/2023 10:42 PM  Performed by: Laurence Spates, MD Authorized by: Laurence Spates, MD   Consent:    Consent obtained:  Verbal   Consent given by:  Patient   Risks, benefits, and alternatives were discussed: yes     Risks discussed:  Bleeding, damage to  other organs, infection, incomplete drainage and pain   Alternatives discussed:  Alternative treatment Universal protocol:    Immediately prior to procedure, a time out was called: yes     Patient identity confirmed:  Verbally with patient Location:    Type:  Abscess   Size:  2 cm   Location:  Lower extremity   Lower extremity location:  Leg   Leg location:  L lower leg Pre-procedure details:    Skin preparation:  Chlorhexidine Sedation:    Sedation type:  None Anesthesia:    Anesthesia method:  Local infiltration   Local anesthetic:  Lidocaine 2% WITH epi Procedure type:    Complexity:  Simple Procedure details:    Ultrasound guidance: yes     Needle aspiration: yes     Needle size:  18 G   Incision types:  Stab incision   Drainage:  Bloody and purulent   Drainage amount:  Scant   Wound treatment:  Wound left open   Packing materials:  None Post-procedure details:    Procedure completion:  Tolerated well, no immediate complications     Medications Ordered in ED Medications  Tdap (BOOSTRIX) injection 0.5 mL (0.5 mLs Intramuscular Given 04/24/23 0349)  doxycycline (VIBRA-TABS) tablet 100 mg (100 mg Oral Given 04/24/23 2130)    ED Course/ Medical Decision Making/ A&P                                 Medical Decision Making Risk Prescription drug management.   Medical Decision Making:   Yadrian Hibma is a 72 y.o. male who presented to the ED today with left leg pain and swelling after recent fall.  He is hypertensive here but otherwise stable.  He is well-appearing.  He is left extremity pitting edema with area of erythema and possible fluctuance over the left anterior tibia.Marland Kitchen  Redness is relatively small, bedside ultrasound shows cobblestoning consistent cellulitis and possible fluid collection.  Will needle aspirate and see if there is posterior to drain.  I suspect the swelling in the rest of his foot is likely inflammatory related to the infection.  No signs of  necrotizing infection or sepsis, he has no leukocytosis and normal lactic acid.  However consider possible DVT as well.  No PE symptoms.   Patient placed on continuous vitals and telemetry monitoring while in ED which was reviewed periodically.  Reviewed and confirmed nursing documentation for past medical history, family history, social history.  Reassessment and Plan:    Aspiration performed as above.  Smaller purulence appropriately blood was obtained consistent with likely hematoma.  No signs of necrotizing infection.  His lab work is overall reassuring, no leukocytosis lactic acid is normal 1 and he medically stable that signs of sepsis.  Sodium slightly low at 131, recommend he have this rechecked.  I do not think he needs admission at this time.  He was given a tetanus shot as well as doxycycline will be started on doxycycline.  Also plan for outpatient DVT study later this morning through the outpatient protocol.  Did not give empiric anticoagulation given he required needle aspiration here and is at increased risk of bleeding.  No PE symptoms.  Strict return precautions given.  Discharged in stable condition.   Patient's presentation is most consistent with acute complicated illness / injury requiring diagnostic workup.           Final Clinical Impression(s) / ED Diagnoses Final diagnoses:  Cellulitis of left lower extremity    Rx / DC Orders ED Discharge Orders          Ordered    doxycycline (VIBRAMYCIN) 100 MG capsule  2 times daily        04/24/23 0613    LE Venous       Comments: IMPORTANT PATIENT INSTRUCTIONS:  You have been scheduled for an Outpatient Vascular Study at [facility].    If tomorrow is a Saturday, Sunday or holiday, please go to the [facility] Emergency Department Registration Desk at 11 am tomorrow morning and tell them you are there for a vascular study.   If tomorrow is a weekday (Monday-Friday), please go to [facility] Entrance C,  [facility] Clinic Registration at 11 am and tell them you are there for a vascular study.   04/24/23 0542              Laurence Spates, MD 04/24/23 2242

## 2023-04-24 NOTE — Progress Notes (Signed)
VASCULAR LAB    Left lower extremity venous duplex has been performed.  See CV proc for preliminary results.   Tanaja Ganger, RVT 04/24/2023, 12:46 PM

## 2023-04-24 NOTE — Telephone Encounter (Signed)
Called by vascular tech about the patient's prescription that was sent to a pharmacy that is not open over the weekend.  The patient had an ultrasound performed today that was negative for DVT.  They are requesting that doxycycline be sent to a pharmacy that is open during the weekend.  This is recent.

## 2023-04-24 NOTE — Discharge Instructions (Addendum)
You were seen today for leg swelling and pain.  We drained a small abscess and you are being placed on antibiotics for cellulitis.  We are also concerned for possible blood clot.  See the instructions below, you need to go to [facility] later this morning to have an ultrasound to evaluate for blood clot as you will likely need blood thinners if you do have 1.  You can take Tylenol, Motrin for pain.  Make sure you eat when you take the antibiotics.  You should follow-up with a doctor within the next week for reevaluation.  If you develop fever, chest pain, shortness of breath, worsening pain or swelling or redness you should return to the ED.  You have been scheduled for an Outpatient Vascular Study at [facility].    If tomorrow is a Saturday, Sunday or holiday, please go to the [facility] Emergency Department Registration Desk at 11 am tomorrow morning and tell them you are there for a vascular study.   If tomorrow is a weekday (Monday-Friday), please go to [facility] Entrance C, [facility] Registration at 11 am and tell them you are there for a vascular study.

## 2023-05-10 ENCOUNTER — Other Ambulatory Visit: Payer: Self-pay | Admitting: Anesthesiology

## 2023-05-10 DIAGNOSIS — M542 Cervicalgia: Secondary | ICD-10-CM

## 2023-05-10 DIAGNOSIS — G540 Brachial plexus disorders: Secondary | ICD-10-CM

## 2023-05-21 ENCOUNTER — Inpatient Hospital Stay: Admission: RE | Admit: 2023-05-21 | Payer: 59 | Source: Ambulatory Visit

## 2023-06-08 ENCOUNTER — Ambulatory Visit
Admission: RE | Admit: 2023-06-08 | Discharge: 2023-06-08 | Disposition: A | Discharge status: Home / Self Care | Payer: 59 | Source: Ambulatory Visit | Attending: Anesthesiology | Admitting: Anesthesiology

## 2023-06-08 ENCOUNTER — Other Ambulatory Visit: Payer: 59

## 2023-06-08 DIAGNOSIS — M542 Cervicalgia: Secondary | ICD-10-CM

## 2023-06-08 DIAGNOSIS — G540 Brachial plexus disorders: Secondary | ICD-10-CM

## 2023-06-16 ENCOUNTER — Emergency Department (HOSPITAL_COMMUNITY): Payer: 59

## 2023-06-16 ENCOUNTER — Emergency Department (HOSPITAL_COMMUNITY)
Admission: EM | Admit: 2023-06-16 | Discharge: 2023-06-17 | Discharge status: Against Medical Advice | Payer: 59 | Attending: Emergency Medicine | Admitting: Emergency Medicine

## 2023-06-16 ENCOUNTER — Encounter (HOSPITAL_COMMUNITY): Payer: Self-pay

## 2023-06-16 ENCOUNTER — Other Ambulatory Visit: Payer: Self-pay

## 2023-06-16 DIAGNOSIS — M79644 Pain in right finger(s): Secondary | ICD-10-CM | POA: Insufficient documentation

## 2023-06-16 DIAGNOSIS — Z5321 Procedure and treatment not carried out due to patient leaving prior to being seen by health care provider: Secondary | ICD-10-CM | POA: Diagnosis not present

## 2023-06-16 LAB — CBC
HCT: 35 % — ABNORMAL LOW (ref 39.0–52.0)
Hemoglobin: 11.3 g/dL — ABNORMAL LOW (ref 13.0–17.0)
MCH: 30.9 pg (ref 26.0–34.0)
MCHC: 32.3 g/dL (ref 30.0–36.0)
MCV: 95.6 fL (ref 80.0–100.0)
Platelets: 247 10*3/uL (ref 150–400)
RBC: 3.66 MIL/uL — ABNORMAL LOW (ref 4.22–5.81)
RDW: 13.2 % (ref 11.5–15.5)
WBC: 10.6 10*3/uL — ABNORMAL HIGH (ref 4.0–10.5)
nRBC: 0 % (ref 0.0–0.2)

## 2023-06-16 LAB — BASIC METABOLIC PANEL
Anion gap: 12 (ref 5–15)
BUN: 19 mg/dL (ref 8–23)
CO2: 22 mmol/L (ref 22–32)
Calcium: 9 mg/dL (ref 8.9–10.3)
Chloride: 102 mmol/L (ref 98–111)
Creatinine, Ser: 0.93 mg/dL (ref 0.61–1.24)
GFR, Estimated: >60 mL/min (ref >60)
Glucose, Bld: 104 mg/dL — ABNORMAL HIGH (ref 70–99)
Potassium: 4.2 mmol/L (ref 3.5–5.1)
Sodium: 136 mmol/L (ref 135–145)

## 2023-06-16 LAB — I-STAT CG4 LACTIC ACID, ED: Lactic Acid, Venous: 0.4 mmol/L — ABNORMAL LOW (ref 0.5–1.9)

## 2023-06-16 NOTE — ED Provider Triage Note (Signed)
 Emergency Medicine Provider Triage Evaluation Note  ARDEAN MELROY , a 73 y.o. male  was evaluated in triage.  Pt complains of severe pain and swelling of right thumb, right index finger that started 4 days ago.  No known injury.  Concerned about possible infection.  Has been applying Epsom salts.  Has some lacerations around the fingers.  No previous history of gout.  Review of Systems  Positive: Finger pain, swelling Negative: Fever, chills  Physical Exam  BP (!) 123/90 (BP Location: Right Arm)   Pulse 77   Temp 98.8 F (37.1 C) (Oral)   Resp 18   Ht 6' (1.829 m)   Wt 84.4 kg   SpO2 100%   BMI 25.23 kg/m  Gen:   Awake, no distress   Resp:  Normal effort  MSK:   Moves extremities without difficulty  Other:   See photos in chart Distal cap refill intact although some blue under the thumb nailbed  Medical Decision Making  Medically screening exam initiated at 9:20 PM.  Appropriate orders placed.  MONTERRIUS CARDOSA was informed that the remainder of the evaluation will be completed by another provider, this initial triage assessment does not replace that evaluation, and the importance of remaining in the ED until their evaluation is complete.  Workup initiated in triage    Rosan Sherlean VEAR DEVONNA 06/16/23 2121

## 2023-06-16 NOTE — ED Triage Notes (Signed)
Pt arrives with c/o right thumb pain and swelling that started about 4 days ago. Pt denies injury. Pt has significant swelling and bluish tint to nail bed.

## 2023-06-16 NOTE — ED Notes (Signed)
 1st lac normal PA said can be canceled

## 2023-06-17 NOTE — ED Notes (Signed)
 Pt eloped from the lobby.

## 2023-06-30 ENCOUNTER — Emergency Department (HOSPITAL_COMMUNITY)
Admission: EM | Admit: 2023-06-30 | Discharge: 2023-06-30 | Disposition: A | Discharge status: Home / Self Care | Payer: 59 | Attending: Emergency Medicine | Admitting: Emergency Medicine

## 2023-06-30 ENCOUNTER — Encounter (HOSPITAL_COMMUNITY): Payer: Self-pay | Admitting: *Deleted

## 2023-06-30 ENCOUNTER — Emergency Department (HOSPITAL_COMMUNITY): Payer: 59

## 2023-06-30 ENCOUNTER — Other Ambulatory Visit: Payer: Self-pay

## 2023-06-30 DIAGNOSIS — M79641 Pain in right hand: Secondary | ICD-10-CM | POA: Insufficient documentation

## 2023-06-30 LAB — BASIC METABOLIC PANEL WITH GFR
Anion gap: 14 (ref 5–15)
BUN: 17 mg/dL (ref 8–23)
CO2: 22 mmol/L (ref 22–32)
Calcium: 9.4 mg/dL (ref 8.9–10.3)
Chloride: 101 mmol/L (ref 98–111)
Creatinine, Ser: 0.97 mg/dL (ref 0.61–1.24)
GFR, Estimated: >60 mL/min (ref >60)
Glucose, Bld: 86 mg/dL (ref 70–99)
Potassium: 3.6 mmol/L (ref 3.5–5.1)
Sodium: 137 mmol/L (ref 135–145)

## 2023-06-30 LAB — CBC WITH DIFFERENTIAL/PLATELET
Abs Immature Granulocytes: 0.06 K/uL (ref 0.00–0.07)
Basophils Absolute: 0.1 K/uL (ref 0.0–0.1)
Basophils Relative: 1 %
Eosinophils Absolute: 0.3 K/uL (ref 0.0–0.5)
Eosinophils Relative: 3 %
HCT: 39.6 % (ref 39.0–52.0)
Hemoglobin: 12.9 g/dL — ABNORMAL LOW (ref 13.0–17.0)
Immature Granulocytes: 1 %
Lymphocytes Relative: 26 %
Lymphs Abs: 2.9 K/uL (ref 0.7–4.0)
MCH: 30.5 pg (ref 26.0–34.0)
MCHC: 32.6 g/dL (ref 30.0–36.0)
MCV: 93.6 fL (ref 80.0–100.0)
Monocytes Absolute: 0.7 K/uL (ref 0.1–1.0)
Monocytes Relative: 7 %
Neutro Abs: 7 K/uL (ref 1.7–7.7)
Neutrophils Relative %: 62 %
Platelets: 344 K/uL (ref 150–400)
RBC: 4.23 MIL/uL (ref 4.22–5.81)
RDW: 13.3 % (ref 11.5–15.5)
WBC: 11.1 K/uL — ABNORMAL HIGH (ref 4.0–10.5)
nRBC: 0 % (ref 0.0–0.2)

## 2023-06-30 MED ORDER — CEFTRIAXONE SODIUM 2 G IJ SOLR
2.0000 g | Freq: Once | INTRAMUSCULAR | Status: AC
  Administered 2023-06-30: 2 g via INTRAVENOUS
  Filled 2023-06-30: qty 20

## 2023-06-30 MED ORDER — CEFDINIR 300 MG PO CAPS: 300.0000 mg | ORAL_CAPSULE | Freq: Two times a day (BID) | ORAL | 0 refills | Status: AC

## 2023-06-30 NOTE — ED Provider Notes (Signed)
 [place] EMERGENCY DEPARTMENT AT [facility] Provider Note   CSN: [id number] Arrival date & time: 06/30/23  1707     History Chief Complaint  Patient presents with   Hand Pain    HPI Nathaniel Miles is a 73 y.o. male presenting for hand pain right hand first, third and left hand 2nd digit swelling and pain. 3 weeks. Came to ED 06/15/2018  Patient's recorded medical, surgical, social, medication list and allergies were reviewed in the Snapshot window as part of the initial history.   Review of Systems   Review of Systems  Constitutional:  Positive for fever. Negative for chills.  HENT:  Negative for ear pain and sore throat.   Eyes:  Negative for pain and visual disturbance.  Respiratory:  Positive for cough and shortness of breath.   Cardiovascular:  Negative for chest pain and palpitations.  Gastrointestinal:  Negative for abdominal pain and vomiting.  Genitourinary:  Negative for dysuria and hematuria.  Musculoskeletal:  Negative for arthralgias and back pain.  Skin:  Negative for color change and rash.  Neurological:  Negative for seizures and syncope.  All other systems reviewed and are negative.   Physical Exam Updated Vital Signs BP 106/87 (BP Location: Left Arm)   Pulse 80   Temp 98.3 F (36.8 C) (Oral)   Resp 18   Ht 6' (1.829 m)   Wt 84.4 kg   SpO2 100%   BMI 25.24 kg/m  Physical Exam Vitals and nursing note reviewed.  Constitutional:      General: He is not in acute distress.    Appearance: He is well-developed.  HENT:     Head: Normocephalic and atraumatic.  Eyes:     Conjunctiva/sclera: Conjunctivae normal.  Cardiovascular:     Rate and Rhythm: Normal rate and regular rhythm.     Heart sounds: No murmur heard. Pulmonary:     Effort: Pulmonary effort is normal. No respiratory distress.     Breath sounds: Normal breath sounds.  Abdominal:     Palpations: Abdomen is soft.     Tenderness: There is no abdominal tenderness.   Musculoskeletal:        General: Deformity and signs of injury present. No swelling.     Cervical back: Neck supple.  Skin:    General: Skin is warm and dry.     Capillary Refill: Capillary refill takes less than 2 seconds.  Neurological:     Mental Status: He is alert.  Psychiatric:        Mood and Affect: Mood normal.      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 Discussed with hand surgery on-call.  His lab work shows a mild leukocytosis.  Fortunately his thumb is worse than the fingers. Sent pictures and discussed with Dr. Orlan Leavens of hand surgery. He agreed with antibiotics and stated the patient needed to come to clinic first thing in the morning for definitive management.  Discussed immediate incision and drainage Dr. Orlan Leavens recommended waiting until clinic in the a.m. for definitive drainage and management. X-ray performed to evaluate for osteomyelitis and grossly negative.  Disposition:  I have considered need for hospitalization, however, considering all of the above, I believe this  patient is stable for discharge at this time.  Patient/family educated about specific return precautions for given chief complaint and symptoms.  Patient/family educated about follow-up with PCP and hand surgery in the AM.     Patient/family expressed understanding of return precautions and need for follow-up. Patient spoken to regarding all imaging and laboratory results and appropriate follow up for these results. All education provided in verbal form with additional information in written form. Time was allowed for answering of patient questions. Patient discharged.    Emergency Department Medication Summary:    Medications  cefTRIAXone (ROCEPHIN) 2 g in sodium chloride 0.9 % 100 mL IVPB (0 g Intravenous Stopped 06/30/23 1823)        Clinical Impression:  1. Right hand pain      Discharge   Final Clinical Impression(s) / ED Diagnoses Final diagnoses:  Right hand pain    Rx / DC Orders ED Discharge Orders          Ordered    cefdinir (OMNICEF) 300 MG capsule  2 times daily        06/30/23 1917              Glyn Ade, MD 06/30/23 2310

## 2023-06-30 NOTE — ED Notes (Signed)
 Patient iv d/c with cath intact. Patient thumb dressed and wrapped. Patient is in no noted distress at the present time will continue to monitor for any changes.

## 2023-06-30 NOTE — ED Notes (Signed)
 Patient d/c home with orders and follow up with pcp as directed, patient verbalized understanding of orders.

## 2023-06-30 NOTE — Discharge Instructions (Addendum)
 Please call Dr. Bari Edward Office at 0830 in the morning to find out when to be there.

## 2023-06-30 NOTE — ED Triage Notes (Signed)
 The pt has finger tips that the nails are falling off and swollen  it started on the rt thumb now 2 other fingers are starting out the same

## 2023-07-29 DIAGNOSIS — H269 Unspecified cataract: Secondary | ICD-10-CM | POA: Insufficient documentation

## 2023-07-29 DIAGNOSIS — H409 Unspecified glaucoma: Secondary | ICD-10-CM | POA: Insufficient documentation

## 2023-07-29 DIAGNOSIS — R519 Other chronic pain: Secondary | ICD-10-CM | POA: Insufficient documentation

## 2023-07-29 DIAGNOSIS — F319 Bipolar disorder, unspecified: Secondary | ICD-10-CM | POA: Insufficient documentation

## 2023-07-29 DIAGNOSIS — G939 Disorder of brain, unspecified: Secondary | ICD-10-CM | POA: Insufficient documentation

## 2023-07-29 DIAGNOSIS — D329 Benign neoplasm of meninges, unspecified: Secondary | ICD-10-CM | POA: Insufficient documentation

## 2023-08-02 ENCOUNTER — Encounter: Payer: Self-pay | Admitting: Neurology

## 2023-08-02 ENCOUNTER — Ambulatory Visit: Payer: 59 | Admitting: Neurology

## 2024-01-07 ENCOUNTER — Emergency Department (HOSPITAL_COMMUNITY)

## 2024-01-07 ENCOUNTER — Encounter (HOSPITAL_COMMUNITY): Payer: Self-pay | Admitting: Emergency Medicine

## 2024-01-07 ENCOUNTER — Emergency Department (HOSPITAL_COMMUNITY)
Admission: EM | Admit: 2024-01-07 | Discharge: 2024-01-07 | Discharge status: Against Medical Advice | Source: Ambulatory Visit | Attending: Emergency Medicine | Admitting: Emergency Medicine

## 2024-01-07 DIAGNOSIS — Z5321 Procedure and treatment not carried out due to patient leaving prior to being seen by health care provider: Secondary | ICD-10-CM | POA: Diagnosis not present

## 2024-01-07 DIAGNOSIS — R0602 Shortness of breath: Secondary | ICD-10-CM | POA: Insufficient documentation

## 2024-01-07 NOTE — ED Triage Notes (Signed)
 Pt arriving via [facility] from doctors office for possible COPD exacerbation. Pt reports increased weakness.

## 2024-01-07 NOTE — ED Notes (Signed)
 Patient called for vital check   he was sitting outside    when asked if he was coming back in he said no that we had to provide him with a ride back to his car   he was advised that we can do a bus ticket for him if he'd like    he waved his hands in the air

## 2024-01-07 NOTE — ED Notes (Signed)
 Patient upset about someone telling him that they will give him a ride to go get his ride. RN tried to explain we can't do that and we need to consult Charge. Patient ask RN's name and stated Are you even legal to work here . Patient being rude and racist to RN. Security and one RN about the comment. Patient escorted out to the lobby to help him fix his ride.

## 2024-01-07 NOTE — ED Notes (Signed)
 Pt is going to need assistance in getting back to his car at the doctor's office

## 2024-02-04 ENCOUNTER — Emergency Department (HOSPITAL_BASED_OUTPATIENT_CLINIC_OR_DEPARTMENT_OTHER)
Admission: EM | Admit: 2024-02-04 | Discharge: 2024-02-05 | Disposition: A | Discharge status: Home / Self Care | Attending: Emergency Medicine | Admitting: Emergency Medicine

## 2024-02-04 ENCOUNTER — Emergency Department (HOSPITAL_BASED_OUTPATIENT_CLINIC_OR_DEPARTMENT_OTHER): Admitting: Radiology

## 2024-02-04 ENCOUNTER — Other Ambulatory Visit: Payer: Self-pay

## 2024-02-04 ENCOUNTER — Emergency Department (HOSPITAL_BASED_OUTPATIENT_CLINIC_OR_DEPARTMENT_OTHER)

## 2024-02-04 DIAGNOSIS — J9601 Acute respiratory failure with hypoxia: Secondary | ICD-10-CM | POA: Diagnosis not present

## 2024-02-04 DIAGNOSIS — J441 Chronic obstructive pulmonary disease with (acute) exacerbation: Secondary | ICD-10-CM | POA: Diagnosis present

## 2024-02-04 DIAGNOSIS — J189 Pneumonia, unspecified organism: Secondary | ICD-10-CM

## 2024-02-04 DIAGNOSIS — I2699 Other pulmonary embolism without acute cor pulmonale: Secondary | ICD-10-CM | POA: Diagnosis present

## 2024-02-04 DIAGNOSIS — Z720 Tobacco use: Secondary | ICD-10-CM | POA: Diagnosis not present

## 2024-02-04 DIAGNOSIS — J449 Chronic obstructive pulmonary disease, unspecified: Secondary | ICD-10-CM | POA: Diagnosis not present

## 2024-02-04 DIAGNOSIS — J181 Lobar pneumonia, unspecified organism: Secondary | ICD-10-CM | POA: Diagnosis not present

## 2024-02-04 DIAGNOSIS — R0602 Shortness of breath: Secondary | ICD-10-CM | POA: Diagnosis present

## 2024-02-04 LAB — APTT: aPTT: 28 s (ref 24–36)

## 2024-02-04 LAB — PROTIME-INR
INR: 0.9 (ref 0.8–1.2)
Prothrombin Time: 12.9 s (ref 11.4–15.2)

## 2024-02-04 LAB — CBC
HCT: 36.6 % — ABNORMAL LOW (ref 39.0–52.0)
Hemoglobin: 12.1 g/dL — ABNORMAL LOW (ref 13.0–17.0)
MCH: 31.5 pg (ref 26.0–34.0)
MCHC: 33.1 g/dL (ref 30.0–36.0)
MCV: 95.3 fL (ref 80.0–100.0)
Platelets: 261 K/uL (ref 150–400)
RBC: 3.84 MIL/uL — ABNORMAL LOW (ref 4.22–5.81)
RDW: 14.3 % (ref 11.5–15.5)
WBC: 8.4 K/uL (ref 4.0–10.5)
nRBC: 0 % (ref 0.0–0.2)

## 2024-02-04 LAB — BASIC METABOLIC PANEL WITH GFR
Anion gap: 13 (ref 5–15)
BUN: 13 mg/dL (ref 8–23)
CO2: 22 mmol/L (ref 22–32)
Calcium: 9.4 mg/dL (ref 8.9–10.3)
Chloride: 102 mmol/L (ref 98–111)
Creatinine, Ser: 0.88 mg/dL (ref 0.61–1.24)
GFR, Estimated: >60 mL/min (ref >60)
Glucose, Bld: 93 mg/dL (ref 70–99)
Potassium: 3.8 mmol/L (ref 3.5–5.1)
Sodium: 138 mmol/L (ref 135–145)

## 2024-02-04 LAB — PROCALCITONIN: Procalcitonin: <0.1 ng/mL

## 2024-02-04 LAB — LACTIC ACID, PLASMA: Lactic Acid, Venous: 1.3 mmol/L (ref 0.5–1.9)

## 2024-02-04 MED ORDER — IOHEXOL 300 MG/ML  SOLN
80.0000 mL | Freq: Once | INTRAMUSCULAR | Status: AC | PRN
  Administered 2024-02-04: 80 mL via INTRAVENOUS

## 2024-02-04 MED ORDER — IPRATROPIUM-ALBUTEROL 0.5-2.5 (3) MG/3ML IN SOLN
3.0000 mL | Freq: Once | RESPIRATORY_TRACT | Status: AC
  Administered 2024-02-04: 3 mL via RESPIRATORY_TRACT
  Filled 2024-02-04: qty 3

## 2024-02-04 MED ORDER — HEPARIN BOLUS VIA INFUSION
4500.0000 [IU] | Freq: Once | INTRAVENOUS | Status: AC
  Administered 2024-02-04: 4500 [IU] via INTRAVENOUS

## 2024-02-04 MED ORDER — LEVOFLOXACIN IN D5W 750 MG/150ML IV SOLN
750.0000 mg | Freq: Once | INTRAVENOUS | Status: AC
  Administered 2024-02-04: 750 mg via INTRAVENOUS
  Filled 2024-02-04: qty 150

## 2024-02-04 MED ORDER — IPRATROPIUM-ALBUTEROL 0.5-2.5 (3) MG/3ML IN SOLN: 3.0000 mL | RESPIRATORY_TRACT | Status: DC | PRN

## 2024-02-04 MED ORDER — ACETAMINOPHEN 500 MG PO TABS
1000.0000 mg | ORAL_TABLET | Freq: Once | ORAL | Status: AC
Start: 2024-02-04 — End: 2024-02-04
  Administered 2024-02-04: 1000 mg via ORAL
  Filled 2024-02-04: qty 2

## 2024-02-04 MED ORDER — HEPARIN (PORCINE) 25000 UT/250ML-% IV SOLN
1500.0000 [IU]/h | INTRAVENOUS | Status: DC
  Administered 2024-02-04: 1250 [IU]/h via INTRAVENOUS
  Filled 2024-02-04: qty 250

## 2024-02-04 NOTE — ED Triage Notes (Signed)
 Patient reports shortness of breath for a few months. Patient states got a call today from his PCP and they said he might have pneumonia based on sputum sample.

## 2024-02-04 NOTE — ED Notes (Signed)
 Pt given chicken noodle soup with a coke.

## 2024-02-04 NOTE — ED Provider Notes (Signed)
 [place] EMERGENCY DEPARTMENT AT [facility] Provider Note   CSN: [id number] Arrival date & time: 02/04/24  1449     Patient presents with: Shortness of Breath   Nathaniel Miles is a 73 y.o. male.  With a history of COPD tobacco use and pneumonia who presents to the ED for suspected pneumonia.  1.5 weeks of ongoing productive cough with thick green sputum increased shortness of breath.  No fevers chills.  Notes significant fatigue.  Was started on doxycycline  yesterday by PCP but sent here given concern for persistent symptoms.  Denies chest pain nausea vomiting or GI symptoms.    Shortness of Breath      Prior to Admission medications   Medication Sig Start Date End Date Taking? Authorizing Provider  acetaminophen  (TYLENOL ) 500 MG tablet Take by mouth. 10/09/20   [provider]  albuterol  (PROVENTIL  HFA;VENTOLIN  HFA) 108 (90 Base) MCG/ACT inhaler Inhale 1-2 puffs into the lungs every 6 (six) hours as needed for wheezing or shortness of breath. Patient not taking: Reported on 12/24/2017 08/26/16   Annabell Kent T, PA-C  albuterol  (VENTOLIN  HFA) 108 (90 Base) MCG/ACT inhaler Inhale into the lungs. 06/11/23   [provider]  Ascorbic Acid (VITAMIN C PO) Take 1 tablet by mouth daily.    [provider]  ascorbic acid (VITAMIN C) 250 MG tablet Take by mouth. 10/22/20   [provider]  aspirin EC 81 MG tablet Take by mouth. 10/09/20   [provider]  azithromycin  (ZITHROMAX ) 250 MG tablet Take 1 tablet (250 mg total) by mouth daily. Take first 2 tablets together, then 1 every day until finished. 01/12/23   Rolinda Rogue, MD  benzonatate  (TESSALON ) 200 MG capsule Take 1 capsule by mouth every 8 (eight) hours for cough. 01/12/23   Rolinda Rogue, MD  celecoxib (CELEBREX) 200 MG capsule Indications: acute pain following an operation 11/13/20   [provider]  Cholecalciferol ([employer] D3) 2000 units CAPS Take 2,000 Units by mouth daily.     [provider]  cholecalciferol (VITAMIN D3) 25 MCG (1000 UNIT) tablet Take by mouth. 08/15/20   [provider]  cyanocobalamin (VITAMIN B12) 1000 MCG tablet Take 1 tablet by mouth daily. 09/10/20   [provider]  cyclobenzaprine  (FEXMID ) 7.5 MG tablet  03/05/23   [provider]  diclofenac (CATAFLAM) 50 MG tablet  03/05/23   [provider]  diclofenac Sodium (PENNSAID) 2 % SOLN  12/11/22   [provider]  doxycycline  (VIBRAMYCIN ) 100 MG capsule Take 1 capsule (100 mg total) by mouth 2 (two) times daily. 04/24/23   Davis, Jonathon H, MD  doxycycline  (VIBRAMYCIN ) 100 MG capsule Take 1 capsule (100 mg total) by mouth 2 (two) times daily. 04/24/23   Ula Prentice SAUNDERS, MD  Ferrous Sulfate (IRON PO)  12/11/22   [provider]  ferrous sulfate 325 (65 FE) MG tablet Take by mouth. 10/02/20   [provider]  fluticasone (FLONASE) 50 MCG/ACT nasal spray Place into the nose. 02/15/23   [provider]  gabapentin  (NEURONTIN ) 300 MG capsule Take 300 mg by mouth at bedtime. 11/25/17   [provider]  gabapentin  (NEURONTIN ) 300 MG capsule Take by mouth. 10/09/20   [provider]  ibuprofen  (ADVIL ) 400 MG tablet 1 Tablet    [provider]  ibuprofen  (ADVIL ) 800 MG tablet     [provider]  lamoTRIgine  (LAMICTAL ) 100 MG tablet  08/15/17   [provider]  lamoTRIgine  (LAMICTAL )  25 MG tablet 2 Tablets    [provider]  Lidocaine  (ZTLIDO ) 1.8 % PTCH Place onto the skin. 10/02/20   [provider]  loratadine (CLARITIN) 10 MG tablet Take by mouth. 02/15/23   [provider]  methocarbamol (ROBAXIN) 500 MG tablet Take by mouth. 10/09/20   [provider]  Multiple Vitamin (MULTI-VITAMINS) TABS Take 1 tablet by mouth daily.    [provider]  Oxycodone  HCl 10 MG TABS Take 10 mg by mouth 4 (four) times daily as needed.    [provider]   oxyCODONE -acetaminophen  (PERCOCET) 7.5-325 MG tablet Take 1 tablet by mouth 3 (three) times daily. for 7 days 12/17/17   [provider]  predniSONE  (DELTASONE ) 50 MG tablet Take one tablet by mouth for 5 days. 01/12/23   Rolinda Rogue, MD  pyridoxine (B-6) 100 MG tablet Take by mouth. 08/15/20   [provider]  salmeterol (SEREVENT DISKUS) 50 MCG/ACT diskus inhaler inhale 1 puff by inhalation route 2 times per day in the morning and evening approximately 12 hours apart 12/12/20   [provider]  senna-docusate (SENOKOT-S) 8.6-50 MG tablet Take by mouth. 10/09/20   [provider]  sildenafil (VIAGRA) 25 MG tablet Take by mouth. 07/30/22   [provider]  SYMBICORT 160-4.5 MCG/ACT inhaler Inhale into the lungs.    [provider]  venlafaxine  XR (EFFEXOR -XR) 150 MG 24 hr capsule Take 150 mg by mouth daily.    [provider]  venlafaxine  XR (EFFEXOR -XR) 37.5 MG 24 hr capsule Take 37.5 mg by mouth daily with breakfast.     [provider]  White Petrolatum-Mineral Oil (STYE) 31.9-57.7 % OINT Apply to eye. 10/02/20   [provider]    Allergies: Amoxicillin-pot clavulanate, Butorphanol, Penicillins, and Pregabalin    Review of Systems  Respiratory:  Positive for shortness of breath.     Updated Vital Signs BP 123/69   Pulse 72   Temp 97.8 F (36.6 C) (Oral)   Resp (!) 22   SpO2 95%   Physical Exam Vitals and nursing note reviewed.  HENT:     Head: Normocephalic and atraumatic.  Eyes:     Pupils: Pupils are equal, round, and reactive to light.  Cardiovascular:     Rate and Rhythm: Normal rate and regular rhythm.  Pulmonary:     Effort: Pulmonary effort is normal.     Breath sounds: Examination of the right-upper field reveals wheezing. Examination of the left-upper field reveals wheezing. Wheezing present.  Abdominal:     Palpations: Abdomen is soft.     Tenderness: There is no abdominal tenderness.   Skin:    General: Skin is warm and dry.  Neurological:     Mental Status: He is alert.  Psychiatric:        Mood and Affect: Mood normal.     (all labs ordered are listed, but only abnormal results are displayed) Labs Reviewed  CBC - Abnormal; Notable for the following components:      Result Value   RBC 3.84 (*)    Hemoglobin 12.1 (*)    HCT 36.6 (*)    All other components within normal limits  BASIC METABOLIC PANEL WITH GFR  APTT  PROTIME-INR  HEPARIN  LEVEL (UNFRACTIONATED)  LACTIC ACID, PLASMA  LACTIC ACID, PLASMA  PROCALCITONIN  BLOOD GAS, ARTERIAL    EKG: EKG Interpretation Date/Time:  Friday February 04 2024 14:57:35 EDT Ventricular Rate:  76 PR Interval:  156 QRS Duration:  90 QT Interval:  392 QTC Calculation: 441 R Axis:   -21  Text Interpretation: Normal sinus rhythm Normal ECG When compared with ECG of 12-Jan-2023 18:02, QRS axis Shifted right Confirmed by Pamella Sharper [phone]) on 02/04/2024 9:39:48 PM  Radiology: CT Chest W Contrast Addendum Date: 02/04/2024 ADDENDUM REPORT: 02/04/2024 18:36 ADDENDUM: Critical Value/emergent results were called by telephone at the time of interpretation by Dr Duwaine Severs on February 04, 2024 at 3:30 p.m. to provider PA Arial Zelaya who verbally acknowledged these results. Electronically Signed   By: Megan  Zare M.D.   On: 02/04/2024 18:36   Result Date: 02/04/2024 CLINICAL DATA:  Right lower lobe consolidation versus mass, shortness of breath, history of SCC of buccal mucosa. EXAM: CT CHEST WITH CONTRAST TECHNIQUE: Multidetector CT imaging of the chest was performed during intravenous contrast administration. RADIATION DOSE REDUCTION: This exam was performed according to the departmental dose-optimization program which includes automated exposure control, adjustment of the mA and/or kV according to patient size and/or use of iterative reconstruction technique. CONTRAST:  80mL OMNIPAQUE  IOHEXOL  300 MG/ML  SOLN COMPARISON:   Chest radiograph February 04, 2019 CT angio chest August 26, 2016 FINDINGS: Cardiovascular: Multiple partially obstructive filling defects throughout segmental and subsegmental branches of all lobes suggestive of multifocal pulmonary emboli. The heart size is normal. Pulmonary arteries prominent measuring 30 mm. Atherosclerotic calcifications of coronary arteries. No pericardial effusion. Mediastinum/Nodes: Few subcentimeter mediastinal lymph nodes. Lungs/Pleura: Linear scarring/atelectasis involving the anterior aspect of right upper lobe (5/125). Left lower lobe ill-defined ground-glass nodules measuring 8 and 11 mm (3/110, 112). Right middle lobe perifissural nodule measuring 7 mm (5/123). Additional scattered pulmonary nodules for example along the left major fissure measuring 5 mm (3/89). These nodules were not visualized on prior exam from 2018 or obscured by consolidations. No pleural effusion. No significant consolidation, emphysematous changes or honeycombing. Mild bronchial and bronchiolar wall thickening. Upper Abdomen: Hepatic steatosis. Musculoskeletal: Degenerative changes of spine and mild compression deformity of upper lumbar spine. IMPRESSION: Multiple filling defects throughout the segmental and subsegmental pulmonary artery branches of all lobes consistent with multifocal pulmonary emboli. Solid and ground-glass pulmonary nodules as detailed above. Recommend follow-up according to oncologic protocols in this patient with known primary malignancy. Please note left lower lobe ground-glass nodules may be post infectious/inflammatory process versus due to pulmonary emboli, however adenocarcinoma spectrum can not be completely excluded. Atherosclerotic calcifications of coronary arteries. Enlarged pulmonary artery which can be seen the setting of pulmonary artery hypertension. Electronically Signed: By: Megan  Zare M.D. On: 02/04/2024 18:21   DG Chest 2 View Result Date: 02/04/2024 CLINICAL DATA:   Shortness of breath with history of COPD. EXAM: CHEST - 2 VIEW COMPARISON:  Chest radiograph 01/12/2023.  CT 08/26/2016 FINDINGS: Normal heart size and pulmonary vascularity. Emphysematous changes in the lungs. There is an area of mass or consolidation demonstrated in the right lung base. This is more prominent than on the prior chest radiograph. Consolidative changes were seen in this area on the older CT scan. Follow-up CT recommended to exclude a developing mass lesion. No pleural effusion or pneumothorax. Mediastinal contours appear intact. Calcification of the aorta. IMPRESSION: Developing mass or consolidation in the right lower lung. CT recommended for further evaluation. Emphysematous changes in the lungs. Electronically Signed   By: Elsie Gravely M.D.   On: 02/04/2024 15:43     .Critical Care  Performed by: Pamella Sharper LABOR, DO Authorized by: Pamella Sharper LABOR, DO   Critical care provider statement:  Critical care time (minutes):  40   Critical care was necessary to treat or prevent imminent or life-threatening deterioration of the following conditions:  Respiratory failure   Critical care was time spent personally by me on the following activities:  Development of treatment plan with patient or surrogate, discussions with consultants, evaluation of patient's response to treatment, examination of patient, ordering and review of laboratory studies, ordering and review of radiographic studies, ordering and performing treatments and interventions, pulse oximetry, re-evaluation of patient's condition, review of old charts and obtaining history from patient or surrogate   I assumed direction of critical care for this patient from another provider in my specialty: no     Care discussed with: admitting provider      Medications Ordered in the ED  heparin  ADULT infusion 100 units/mL (25000 units/250mL) (1,250 Units/hr Intravenous New Bag/Given 02/04/24 1942)  ipratropium-albuterol  (DUONEB)  0.5-2.5 (3) MG/3ML nebulizer solution 3 mL (has no administration in time range)  ipratropium-albuterol  (DUONEB) 0.5-2.5 (3) MG/3ML nebulizer solution 3 mL (3 mLs Nebulization Given 02/04/24 1755)  levofloxacin  (LEVAQUIN ) IVPB 750 mg (0 mg Intravenous Stopped 02/04/24 1935)  iohexol  (OMNIPAQUE ) 300 MG/ML solution 80 mL (80 mLs Intravenous Contrast Given 02/04/24 1749)  acetaminophen  (TYLENOL ) tablet 1,000 mg (1,000 mg Oral Given 02/04/24 1805)  heparin  bolus via infusion 4,500 Units (4,500 Units Intravenous Bolus from Bag 02/04/24 1942)    Clinical Course as of 02/04/24 2142  Fri Feb 04, 2024  2140 Initial laboratory workup unremarkable overall.  Initial x-ray was concerning for right-sided pneumonia.  CT chest with IV contrast showed segmental and subsegmental PEs.  Concerning for potential adenocarcinoma in this smoker with prior history of cancer.  Desaturated to low 80s on room air.  Stable on 4 L nasal cannula.  Will initiate heparin  bolus heparin  drip for PEs and admit to medicine [MP]    Clinical Course User Index [MP] Pamella Ozell LABOR, DO                                 Medical Decision Making 73 year old male with history as above presenting given concern for pneumonia.  COPD.  History of severe pneumonia.  Afebrile normotensive stable on room air here.  No respiratory distress.  Significant fatigue.  X-ray shows right lower lobe pneumonia versus mass.  Recommendation of CT to better characterize.  Will give a DuoNeb treatment here and antibiotics for community-acquired pneumonia.  Will dose Levaquin  as he has a penicillin allergy.  Amount and/or Complexity of Data Reviewed Labs: ordered. Radiology: ordered.  Risk OTC drugs. Prescription drug management. Decision regarding hospitalization.        Final diagnoses:  Acute pulmonary embolism, unspecified pulmonary embolism type, unspecified whether acute cor pulmonale present (HCC)  Pneumonia of right lower lobe due to infectious  organism  Chronic obstructive pulmonary disease, unspecified COPD type (HCC)  Acute hypoxemic respiratory failure [facility])    ED Discharge Orders     None          Pamella Ozell LABOR, DO 02/04/24 2142

## 2024-02-04 NOTE — Progress Notes (Signed)
 PHARMACY - ANTICOAGULATION CONSULT NOTE  Pharmacy Consult for heparin  Indication: pulmonary embolus  Allergies  Allergen Reactions   Amoxicillin-Pot Clavulanate Anaphylaxis    Tongue and mouth swelling   Butorphanol Anaphylaxis    06/11/2017 -  08/27/2016 -   Penicillins Other (See Comments) and Rash    Family hx of anaphylaxis, but can tolerate cephalosporins  Has patient had a PCN reaction causing immediate rash, facial/tongue/throat swelling, SOB or lightheadedness with hypotension: No  Has patient had a PCN reaction causing severe rash involving mucus membranes or skin necrosis: No  Has patient had a PCN reaction that required hospitalization Yes  Has patient had a PCN reaction occurring within the last 10 years: No  If all of the above answers are NO, then may proceed with Cephalosporin use.  Has patient had a PCN reaction causing anaphylaxis, immediate rash, facial/tongue/throat swelling, SOB or lightheadedness with hypotension? no  Has patient had a PCN reaction causing severe rash involving mucus membranes or skin necrosis?  no  Has patient had a PCN reaction that required hospitilization?no    Has patient had a PCN reaction occurring within the last 10 years?  no  If all of the above answers are no then may proceed with cephalosporin use.  Patient was told by his parents he should avoi  Has patient had a PCN reaction causing anaphylaxis, immediate rash, facial/tongue/throat swelling, SOB or lightheadedness with hypotension? no, Has patient had a PCN reaction causing severe rash involving mucus membranes or skin necrosis?  no, Has patient had a PCN reaction that required hospitilization?no, , Has patient had a PCN reaction occurring within the last 10 years?  no, If all of the above answers are no then may proceed with cephalosporin use., Patient was told by his parents he should avoi   Pregabalin Anaphylaxis    Patient Measurements:    Vital Signs: Temp: 98 F  (36.7 C) (09/26 1454) BP: 117/70 (09/26 1745) Pulse Rate: 70 (09/26 1745)  Labs: Recent Labs    02/04/24 1500  HGB 12.1*  HCT 36.6*  PLT 261  CREATININE 0.88    CrCl cannot be calculated (Unknown ideal weight.).   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

## 2024-02-05 ENCOUNTER — Other Ambulatory Visit (HOSPITAL_COMMUNITY): Payer: Self-pay | Admitting: *Deleted

## 2024-02-05 ENCOUNTER — Other Ambulatory Visit (HOSPITAL_COMMUNITY)

## 2024-02-05 DIAGNOSIS — J9601 Acute respiratory failure with hypoxia: Secondary | ICD-10-CM | POA: Diagnosis not present

## 2024-02-05 LAB — LACTIC ACID, PLASMA: Lactic Acid, Venous: 0.7 mmol/L (ref 0.5–1.9)

## 2024-02-05 LAB — HEPARIN LEVEL (UNFRACTIONATED): Heparin Unfractionated: 0.22 [IU]/mL — ABNORMAL LOW (ref 0.30–0.70)

## 2024-02-05 MED ORDER — HEPARIN BOLUS VIA INFUSION
3000.0000 [IU] | Freq: Once | INTRAVENOUS | Status: AC
  Administered 2024-02-05: 3000 [IU] via INTRAVENOUS

## 2024-02-05 MED ORDER — RIVAROXABAN 15 MG PO TABS
15.0000 mg | ORAL_TABLET | Freq: Once | ORAL | Status: AC
  Administered 2024-02-05: 15 mg via ORAL
  Filled 2024-02-05: qty 1

## 2024-02-05 MED ORDER — RIVAROXABAN (XARELTO) VTE STARTER PACK (15 & 20 MG): ORAL_TABLET | ORAL | 0 refills | Status: AC

## 2024-02-05 NOTE — ED Notes (Signed)
 ER Provider MD Delo at bedside assess patient's concerns (requesting to leave due to desired attendance at family funeral).

## 2024-02-05 NOTE — ED Provider Notes (Signed)
  Physical Exam  BP (!) 112/94   Pulse (!) 57   Temp 97.9 F (36.6 C) (Oral)   Resp (!) 22   SpO2 94%   Physical Exam Vitals and nursing note reviewed.  Constitutional:      Appearance: He is well-developed.  HENT:     Head: Normocephalic.  Cardiovascular:     Rate and Rhythm: Normal rate and regular rhythm.  Pulmonary:     Effort: Pulmonary effort is normal.  Musculoskeletal:     Cervical back: Normal range of motion.  Skin:    General: Skin is warm and dry.  Neurological:     Mental Status: He is alert.     Procedures  Procedures  ED Course / MDM   Clinical Course as of 02/05/24 0636  Fri Feb 04, 2024  2140 Initial laboratory workup unremarkable overall.  Initial x-ray was concerning for right-sided pneumonia.  CT chest with IV contrast showed segmental and subsegmental PEs.  Concerning for potential adenocarcinoma in this smoker with prior history of cancer.  Desaturated to low 80s on room air.  Stable on 4 L nasal cannula.  Will initiate heparin  bolus heparin  drip for PEs and admit to medicine [MP]    Clinical Course User Index [MP] Pamella Ozell LABOR, DO   Medical Decision Making Amount and/or Complexity of Data Reviewed Labs: ordered. Radiology: ordered.  Risk OTC drugs. Prescription drug management. Decision regarding hospitalization.   Care assumed from previous provider.  Patient being admitted for pulmonary emboli with oxygen requirement.  I was notified by the nurse that patient is requesting discharge.  He has been here through the night waiting on a bed, but tells me he has to be to a funeral for his brother this afternoon.  He understands the risks of leaving the hospital including death and serious disability.  When I was speaking with him, he was in no respiratory distress and did have oxygen saturations of 95 to 98% on room air.  I will prescribe Eliquis and have him follow-up with his primary care doctor.       Geroldine Berg, MD 02/05/24  [phone]

## 2024-02-05 NOTE — Progress Notes (Signed)
 PHARMACY - ANTICOAGULATION CONSULT NOTE  Pharmacy Consult for heparin  Indication: pulmonary embolus  Labs: Recent Labs    02/04/24 1500 02/04/24 1935 02/05/24 0215  HGB 12.1*  --   --   HCT 36.6*  --   --   PLT 261  --   --   APTT  --  28  --   LABPROT  --  12.9  --   INR  --  0.9  --   HEPARINUNFRC  --   --  0.22*  CREATININE 0.88  --   --    Assessment: 72yo male subtherapeutic on heparin  with initial dosing for PE; no signs of bleeding per RN but she notes multiple brief infusion interruptions d/t placement of PIV in [facility].  Goal of Therapy:  Heparin  level 0.3-0.7 units/ml   Plan:  3000 units heparin  bolus. Increase heparin  infusion by 3 units/kg/hr to 1500 units/hr. Check level in 6 hours.   Marvetta Dauphin, PharmD, BCPS 02/05/2024 3:58 AM

## 2024-02-05 NOTE — ED Notes (Signed)
 Patient utilized call bell; This RN to assist; patient demanding to leave stating, I have to leave now, this isn't working for me! MD Delo notified

## 2024-02-05 NOTE — Discharge Instructions (Signed)
 Begin taking Xarelto as prescribed.  It is very important that you fill this prescription and begin taking this medication as you have blood clots in your lungs that could potentially be fatal if left untreated.  You are welcome to return to the emergency department at any time for the admission/hospitalization that was recommended to you, but you have refused.
